# Patient Record
Sex: Female | Born: 1960 | Race: White | Hispanic: No | Smoking: Never smoker
Health system: Southern US, Community
[De-identification: ages and names within clinical notes are randomized; demographics above are authoritative.]

## PROBLEM LIST (undated history)

## (undated) DIAGNOSIS — E785 Hyperlipidemia, unspecified: Secondary | ICD-10-CM

## (undated) DIAGNOSIS — I10 Essential (primary) hypertension: Secondary | ICD-10-CM

## (undated) DIAGNOSIS — K219 Gastro-esophageal reflux disease without esophagitis: Secondary | ICD-10-CM

## (undated) DIAGNOSIS — G43909 Migraine, unspecified, not intractable, without status migrainosus: Secondary | ICD-10-CM

## (undated) DIAGNOSIS — E7800 Pure hypercholesterolemia, unspecified: Secondary | ICD-10-CM

## (undated) DIAGNOSIS — T753XXA Motion sickness, initial encounter: Secondary | ICD-10-CM

## (undated) DIAGNOSIS — N159 Renal tubulo-interstitial disease, unspecified: Secondary | ICD-10-CM

## (undated) DIAGNOSIS — N3090 Cystitis, unspecified without hematuria: Secondary | ICD-10-CM

## (undated) HISTORY — DX: Essential (primary) hypertension: I10

## (undated) HISTORY — DX: Hyperlipidemia, unspecified: E78.5

## (undated) HISTORY — PX: CHOLECYSTECTOMY: SHX55

## (undated) HISTORY — PX: KNEE SURGERY: SHX244

## (undated) HISTORY — DX: Renal tubulo-interstitial disease, unspecified: N15.9

## (undated) HISTORY — DX: Migraine, unspecified, not intractable, without status migrainosus: G43.909

## (undated) HISTORY — DX: Cystitis, unspecified without hematuria: N30.90

## (undated) HISTORY — DX: Pure hypercholesterolemia, unspecified: E78.00

---
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
## 1980-10-13 DIAGNOSIS — O99891 Other specified diseases and conditions complicating pregnancy: Secondary | ICD-10-CM

## 1980-10-13 HISTORY — DX: Other specified diseases and conditions complicating pregnancy: O99.891

## 1996-10-13 HISTORY — PX: CHOLECYSTECTOMY: SHX55

## 1999-10-14 HISTORY — PX: MENISCECTOMY: SHX123

## 2005-10-13 HISTORY — PX: OTHER SURGICAL HISTORY: SHX169

## 2014-06-21 ENCOUNTER — Inpatient Hospital Stay: Admit: 2014-06-21 | Discharge: 2014-06-21 | Disposition: A | Discharge status: Home / Self Care | Payer: Self-pay

## 2017-07-13 ENCOUNTER — Inpatient Hospital Stay: Admit: 2017-07-13 | Discharge: 2017-07-13 | Disposition: A | Discharge status: Home / Self Care | Payer: Self-pay

## 2018-07-30 ENCOUNTER — Inpatient Hospital Stay: Admit: 2018-07-30 | Discharge: 2018-07-30 | Disposition: A | Discharge status: Home / Self Care | Payer: Self-pay

## 2019-08-16 NOTE — Progress Notes (Signed)
Patient referred by Caren Macadam, MD for chest pain  Subjective:   Amy Chan, female    DOB: 1961/07/30, 58 y.o.   MRN: [id number]   Chief Complaint  Patient presents with  . Chest Pain  . Palpitations  . Hospitalization Follow-up    HPI  58 y.o. Caucasian female with yperlipidemia, prior h/o hypertension, referred for evaluation of chest pain, palpitations.  Patient lives with her partner and her daughter, 59, with Down's syndrome-for whom she is the primary caregiver. She has 4 other children. She endorses stress related to the caregiver burden. She has experienced episodes of chest pain, retrosternal, with or without physical activity, lasting for a few min. Pain is not necessarily worse with physical exertion. She also experiences episodes of "flutter sensation" that last for several minutes. They are sometimes associated with shortness of breath, but denies any presyncope or syncope, orthopnea, PND.   Patient was previously on HCTZ, but was able to come off it without any rebound hypertension. LDL elevated, details below.   Patient has FH of AAA rupture in her father. She also has FH brain aneurysm. She underwent brain MRI, which did not show brain aneurysm.  Past Medical History:  Diagnosis Date  . Hyperlipidemia   . Hypertension     Past Surgical History:  Procedure Laterality Date  . CESAREAN SECTION     3x  . CHOLECYSTECTOMY     1998  . KNEE SURGERY     1998, 2000     Social History   Socioeconomic History  . Marital status: Soil scientist    Spouse name: Not on file  . Number of children: 4  . Years of education: Not on file  . Highest education level: Not on file  Occupational History  . Not on file  Social Needs  . Financial resource strain: Not on file  . Food insecurity    Worry: Not on file    Inability: Not on file  . Transportation needs    Medical: Not on file    Non-medical: Not on file  Tobacco Use  . Smoking status: Never  Smoker  . Smokeless tobacco: Never Used  Substance and Sexual Activity  . Alcohol use: Not on file    Comment: occ  . Drug use: Not on file  . Sexual activity: Not on file  Lifestyle  . Physical activity    Days per week: Not on file    Minutes per session: Not on file  . Stress: Not on file  Relationships  . Social Herbalist on phone: Not on file    Gets together: Not on file    Attends religious service: Not on file    Active member of club or organization: Not on file    Attends meetings of clubs or organizations: Not on file    Relationship status: Not on file  . Intimate partner violence    Fear of current or ex partner: Not on file    Emotionally abused: Not on file    Physically abused: Not on file    Forced sexual activity: Not on file  Other Topics Concern  . Not on file  Social History Narrative  . Not on file     Family History  Problem Relation Age of Onset  . Alzheimer's disease Mother   . Aneurysm Father        AAA rupture  . Hypertension Father   . Atrial fibrillation Father   .  Diabetes Sister   . Hypertension Brother   . Aortic aneurysm Paternal Grandfather      Current Outpatient Medications on File Prior to Visit  Medication Sig Dispense Refill  . esomeprazole (NEXIUM) 20 MG capsule Take 20 mg by mouth daily at 12 noon.     No current facility-administered medications on file prior to visit.     Cardiovascular studies:  EKG 08/17/2019: Sinus rhythm 66 bpm. Rightward axis.   Recent labs: 07/14/2019: Glucose 79.  BUN/creatinine 20/0.85.  eGFR 68/83.  Sodium 137, potassium 4.4.  Rest of the CMP normal. Cholesterol 229, triglycerides 140, HDL 47, LDL 154.   Review of Systems  Constitution: Negative for decreased appetite, malaise/fatigue, weight gain and weight loss.  HENT: Negative for congestion.   Eyes: Negative for visual disturbance.  Cardiovascular: Positive for chest pain and palpitations. Negative for dyspnea on  exertion, leg swelling and syncope.  Respiratory: Positive for shortness of breath. Negative for cough.   Endocrine: Negative for cold intolerance.  Hematologic/Lymphatic: Does not bruise/bleed easily.  Skin: Negative for itching and rash.  Musculoskeletal: Negative for myalgias.  Gastrointestinal: Negative for abdominal pain, nausea and vomiting.  Genitourinary: Negative for dysuria.  Neurological: Negative for dizziness and weakness.  Psychiatric/Behavioral: The patient is not nervous/anxious.   All other systems reviewed and are negative.        Vitals:   08/17/19 0910  BP: 123/84  Pulse: 69  Temp: 97.8 F (36.6 C)  SpO2: 99%     Body mass index is 28.67 kg/m. Filed Weights   08/17/19 0910  Weight: 167 lb (75.8 kg)     Objective:   Physical Exam  Constitutional: She is oriented to person, place, and time. She appears well-developed and well-nourished. No distress.  HENT:  Head: Normocephalic and atraumatic.  Eyes: Pupils are equal, round, and reactive to light. Conjunctivae are normal.  Neck: No JVD present.  Cardiovascular: Normal rate, regular rhythm and intact distal pulses.  No murmur heard. Pulmonary/Chest: Effort normal and breath sounds normal. She has no wheezes. She has no rales.  Abdominal: Soft. Bowel sounds are normal. There is no rebound.  Musculoskeletal:        General: No edema.  Lymphadenopathy:    She has no cervical adenopathy.  Neurological: She is alert and oriented to person, place, and time. No cranial nerve deficit.  Skin: Skin is warm and dry.  Psychiatric: She has a normal mood and affect.  Nursing note and vitals reviewed.         Assessment & Recommendations:   58 y.o. Caucasian female with yperlipidemia, prior h/o hypertension, referred for evaluation of chest pain, palpitations.  Palpitations: Likely benign etiology. Recommend 4 week event monitor and echocardiogram.   Chest pain: Atypical. Recommend exercise  treadmill stress test.  Hyperlipidemia: LDL 154. 10 yr ASCVD risk <7%. Recommend diet and lifestyle modifications.   Physical activity recommendation (The Physical Activity Guidelines for Americans. JAMA 2018;Nov 12) At least 150-300 minutes a week of moderate-intensity, or 75-150 minutes a week of vigorous-intensity aerobic physical activity, or an equivalent combination of moderate- and vigorous-intensity aerobic activity. Adults should perform muscle-strengthening activities on 2 or more days a week. Older adults should do multicomponent physical activity that includes balance training as well as aerobic and muscle-strengthening activities. Benefits of increased physical activity include lower risk of mortality including cardiovascular mortality, lower risk of cardiovascular events and associated risk factors (hypertension and diabetes), and lower risk of many cancers (including bladder, breast, colon,  endometrium, esophagus, kidney, lung, and stomach). Additional improvments have been seen in cognition, risk of dementia, anxiety and depression, improved bone health, lower risk of falls, and associated injuries.  Dietary recommendation The 2019 ACC/AHA guidelines promote nutrition as a main fixture of cardiovascular wellness, with a recommendation for a varied diet of fruit, vegetables, fish, legumes, and whole grains (Class I), as well as recommendations to reduce sodium, cholesterol, processed meats, and refined sugars (Class IIa recommendation).10 Sodium intake, a topic of some controversy as of late, is recommended to be kept at 1,500 mg/day or less, far below the average daily intake in the [place] of 3,409 mg/day, and notably below that of previous US recommendations for '300mg'$ /day.10,11 For those unable to reach 1,500 mg/day, they recommend at least a reduction of 1000 mg/day.  A Pesco-Mediterranean Diet With Intermittent Fasting: JACC Review Topic of the Week. J Am Coll Cardiol 0109;32:3557-3220  Pesco-Mediterranean diet, it is supplemented with extra-virgin olive oil (EVOO), which is the principle fat source, along with moderate amounts of dairy (particularly yogurt and cheese) and eggs, as well as modest amounts of alcohol consumption (ideally red wine with the evening meal), but few red and processed meats.   Thank you for referring the patient to [place]. Please feel free to contact with any questions.  Nigel Mormon, MD [facility] Pager: [phone] Office: [phone] If no answer Cell [phone]

## 2019-08-17 ENCOUNTER — Encounter: Payer: Self-pay | Admitting: Cardiology

## 2019-08-17 ENCOUNTER — Other Ambulatory Visit: Payer: Self-pay

## 2019-08-17 ENCOUNTER — Ambulatory Visit: Payer: Managed Care, Other (non HMO)

## 2019-08-17 ENCOUNTER — Ambulatory Visit: Payer: Managed Care, Other (non HMO) | Admitting: Cardiology

## 2019-08-17 VITALS — BP 123/84 | HR 69 | Temp 97.8°F | Ht 64.0 in | Wt 167.0 lb

## 2019-08-17 DIAGNOSIS — Z8249 Family history of ischemic heart disease and other diseases of the circulatory system: Secondary | ICD-10-CM

## 2019-08-17 DIAGNOSIS — R002 Palpitations: Secondary | ICD-10-CM | POA: Diagnosis not present

## 2019-08-17 DIAGNOSIS — R079 Chest pain, unspecified: Secondary | ICD-10-CM | POA: Diagnosis not present

## 2019-08-17 NOTE — Patient Instructions (Signed)
Physical activity recommendation (The Physical Activity Guidelines for Americans. JAMA 2018;Nov 12) At least 150-300 minutes a week of moderate-intensity, or 75-150 minutes a week of vigorous-intensity aerobic physical activity, or an equivalent combination of moderate- and vigorous-intensity aerobic activity. Adults should perform muscle-strengthening activities on 2 or more days a week. Older adults should do multicomponent physical activity that includes balance training as well as aerobic and muscle-strengthening activities. Benefits of increased physical activity include lower risk of mortality including cardiovascular mortality, lower risk of cardiovascular events and associated risk factors (hypertension and diabetes), and lower risk of many cancers (including bladder, breast, colon, endometrium, esophagus, kidney, lung, and stomach). Additional improvments have been seen in cognition, risk of dementia, anxiety and depression, improved bone health, lower risk of falls, and associated injuries.  Dietary recommendation The 2019 ACC/AHA guidelines promote nutrition as a main fixture of cardiovascular wellness, with a recommendation for a varied diet of fruit, vegetables, fish, legumes, and whole grains (Class I), as well as recommendations to reduce sodium, cholesterol, processed meats, and refined sugars (Class IIa recommendation).10 Sodium intake, a topic of some controversy as of late, is recommended to be kept at 1,500 mg/day or less, far below the average daily intake in the [place] of 3,409 mg/day, and notably below that of previous US recommendations for <2,300mg/day.10,11 For those unable to reach 1,500 mg/day, they recommend at least a reduction of 1000 mg/day.  A Pesco-Mediterranean Diet With Intermittent Fasting: JACC Review Topic of the Week. J Am Coll Cardiol 2020;76:1484-1493 Pesco-Mediterranean diet, it is supplemented with extra-virgin olive oil (EVOO), which is the principle fat source, along  with moderate amounts of dairy (particularly yogurt and cheese) and eggs, as well as modest amounts of alcohol consumption (ideally red wine with the evening meal), but few red and processed meats.  

## 2019-08-18 ENCOUNTER — Other Ambulatory Visit: Payer: Self-pay

## 2019-08-18 DIAGNOSIS — Z20822 Contact with and (suspected) exposure to covid-19: Secondary | ICD-10-CM

## 2019-08-19 LAB — NOVEL CORONAVIRUS, NAA: SARS-CoV-2, NAA: NOT DETECTED

## 2019-08-31 ENCOUNTER — Other Ambulatory Visit: Payer: Self-pay

## 2019-08-31 ENCOUNTER — Ambulatory Visit (INDEPENDENT_AMBULATORY_CARE_PROVIDER_SITE_OTHER): Payer: Managed Care, Other (non HMO)

## 2019-08-31 DIAGNOSIS — R079 Chest pain, unspecified: Secondary | ICD-10-CM | POA: Diagnosis not present

## 2019-08-31 DIAGNOSIS — R002 Palpitations: Secondary | ICD-10-CM

## 2019-08-31 DIAGNOSIS — I700 Atherosclerosis of aorta: Secondary | ICD-10-CM

## 2019-08-31 DIAGNOSIS — Z8249 Family history of ischemic heart disease and other diseases of the circulatory system: Secondary | ICD-10-CM

## 2019-09-07 ENCOUNTER — Other Ambulatory Visit: Payer: Self-pay | Admitting: Cardiology

## 2019-09-07 DIAGNOSIS — R072 Precordial pain: Secondary | ICD-10-CM

## 2019-09-07 DIAGNOSIS — R0789 Other chest pain: Secondary | ICD-10-CM

## 2019-09-07 DIAGNOSIS — R079 Chest pain, unspecified: Secondary | ICD-10-CM

## 2019-09-07 MED ORDER — METOPROLOL TARTRATE 25 MG PO TABS: 25.0000 mg | ORAL_TABLET | ORAL | 1 refills | Status: DC

## 2019-09-07 NOTE — Progress Notes (Addendum)
Given her risk factors of hyperlipidemia and inability to perform CCTA in a timely manner, treadmill stress test switched to pharmacological nuclear stress test

## 2019-09-07 NOTE — Addendum Note (Signed)
Addended by: Nigel Mormon on: 09/07/2019 12:46 PM   Modules accepted: Orders

## 2019-09-14 ENCOUNTER — Telehealth: Payer: Self-pay

## 2019-09-14 ENCOUNTER — Encounter: Payer: Self-pay | Admitting: Cardiology

## 2019-09-14 ENCOUNTER — Other Ambulatory Visit: Payer: Self-pay | Admitting: Cardiology

## 2019-09-14 DIAGNOSIS — R072 Precordial pain: Secondary | ICD-10-CM

## 2019-09-14 DIAGNOSIS — I700 Atherosclerosis of aorta: Secondary | ICD-10-CM | POA: Insufficient documentation

## 2019-09-14 DIAGNOSIS — I10 Essential (primary) hypertension: Secondary | ICD-10-CM | POA: Insufficient documentation

## 2019-09-14 DIAGNOSIS — E785 Hyperlipidemia, unspecified: Secondary | ICD-10-CM | POA: Insufficient documentation

## 2019-09-14 MED ORDER — METOPROLOL TARTRATE 25 MG PO TABS: 25.0000 mg | ORAL_TABLET | Freq: Two times a day (BID) | ORAL | 1 refills | Status: AC

## 2019-09-14 NOTE — Telephone Encounter (Signed)
Done. Sent CTA instructions as a patient message. Please follow up.  Thanks MJP

## 2019-09-16 ENCOUNTER — Telehealth: Payer: Self-pay

## 2019-09-16 NOTE — Telephone Encounter (Signed)
error 

## 2019-09-23 ENCOUNTER — Inpatient Hospital Stay: Admit: 2019-09-23 | Discharge: 2019-09-23 | Disposition: A | Discharge status: Home / Self Care | Payer: Self-pay

## 2019-09-24 NOTE — Progress Notes (Deleted)
  Subjective:   Amy Chan, female    DOB: 03/01/1961, 58 y.o.   MRN: [id number]   I connected with the patient on ***09/28/19 by a video enabled telemedicine application and verified that I am speaking with the correct person using two identifiers.     I discussed the limitations of evaluation and management by telemedicine and the availability of in person appointments. The patient expressed understanding and agreed to proceed.   This visit type was conducted due to national recommendations for restrictions regarding the COVID-19 Pandemic (e.g. social distancing).  This format is felt to be most appropriate for this patient at this time.  All issues noted in this document were discussed and addressed.  No physical exam was performed (except for noted visual exam findings with Tele health visits).  The patient has consented to conduct a Tele health visit and understands insurance will be billed.     Chief complaint:  ***  *** HPI  58 y.o. *** female with ***  *** Past Medical History:  Diagnosis Date  . Hyperlipidemia   . Hypertension     *** Past Surgical History:  Procedure Laterality Date  . CESAREAN SECTION     3x  . CHOLECYSTECTOMY     1998  . KNEE SURGERY     1998, 2000    *** Social History   Socioeconomic History  . Marital status: Domestic Partner    Spouse name: Not on file  . Number of children: 4  . Years of education: Not on file  . Highest education level: Not on file  Occupational History  . Not on file  Tobacco Use  . Smoking status: Never Smoker  . Smokeless tobacco: Never Used  Substance and Sexual Activity  . Alcohol use: Not on file    Comment: occ  . Drug use: Not on file  . Sexual activity: Not on file  Other Topics Concern  . Not on file  Social History Narrative  . Not on file   Social Determinants of Health   Financial Resource Strain:   . Difficulty of Paying Living Expenses: Not on file  Food Insecurity:   . Worried  About Running Out of Food in the Last Year: Not on file  . Ran Out of Food in the Last Year: Not on file  Transportation Needs:   . Lack of Transportation (Medical): Not on file  . Lack of Transportation (Non-Medical): Not on file  Physical Activity:   . Days of Exercise per Week: Not on file  . Minutes of Exercise per Session: Not on file  Stress:   . Feeling of Stress : Not on file  Social Connections:   . Frequency of Communication with Friends and Family: Not on file  . Frequency of Social Gatherings with Friends and Family: Not on file  . Attends Religious Services: Not on file  . Active Member of Clubs or Organizations: Not on file  . Attends Club or Organization Meetings: Not on file  . Marital Status: Not on file  Intimate Partner Violence:   . Fear of Current or Ex-Partner: Not on file  . Emotionally Abused: Not on file  . Physically Abused: Not on file  . Sexually Abused: Not on file    *** Family History  Problem Relation Age of Onset  . Alzheimer's disease Mother   . Aneurysm Father        AAA rupture  . Hypertension Father   . Atrial fibrillation Father   .   Diabetes Sister   . Hypertension Brother   . Aortic aneurysm Paternal Grandfather     *** Current Outpatient Medications on File Prior to Visit  Medication Sig Dispense Refill  . esomeprazole (NEXIUM) 20 MG capsule Take 20 mg by mouth daily at 12 noon.    . metoprolol tartrate (LOPRESSOR) 25 MG tablet Take 1 tablet (25 mg total) by mouth 2 (two) times daily for 10 doses. 10 tablet 1   No current facility-administered medications on file prior to visit.    Cardiovascular and other pertinent studies:  *** Echocardiogram 08/31/2019: Left ventricle cavity is normal in size. Mild concentric hypertrophy of the left ventricle. Normal LV systolic function with EF 58%. Normal global wall motion. Normal diastolic filling pattern.  Mild to moderate mitral regurgitation. Normal right atrial pressure.    Abdominal Aortic Duplex  08/31/2019: The maximum aorta (sac) diameter is 2.07 cm (mid). No AAA.  Mild plaque observed in the proximal, mid and distal aorta. Normal flow velocities noted in the aorta and iliac arteries.   Event monitor 08/17/2019 - 09/15/2019: Diagnostic time: 97%  Dominant rhythm: Sinus. HR 51-136 bpm. Avg HR 72 bpm. Occasional PAC's/sinus arrhthymia noted.  No atrial fibrillation/atrial flutter/SVT/VT/high grade AV block, sinus pause >3sec noted. Symptoms reported: Multiple episodes of chest tightness, pressure sensation, skipped beats. Occasional episodes correlated with PAC's. Symptoms reported are out of proportion to findings noted on event monitor.   EKG ***/***/202***: ***  *** Recent labs: ***/***/202***: Glucose ***, BUN/Cr ***/***. EGFR ***. Na/K ***/***. Rest of the CMP normal H/H ***/***. MCV ***. Platelets *** ***HbA1C ***% Chol ***, TG ***, HDL ***, LDL *** ***TSH ***normal   *** ROS      *** There were no vitals filed for this visit. (Measured by the patient using a home BP monitor)  There is no height or weight on file to calculate BMI. There were no vitals filed for this visit.  *** Observation/findings during video visit   Objective:    Physical Exam        Assessment & Recommendations:   ***  ***   Yarden Hillis Esther Hardy, MD [facility] Pager: [phone] Office: [phone]

## 2019-09-26 ENCOUNTER — Other Ambulatory Visit: Payer: Managed Care, Other (non HMO)

## 2019-09-27 ENCOUNTER — Other Ambulatory Visit: Payer: Self-pay | Admitting: Obstetrics and Gynecology

## 2019-09-27 DIAGNOSIS — R928 Other abnormal and inconclusive findings on diagnostic imaging of breast: Secondary | ICD-10-CM

## 2019-09-28 ENCOUNTER — Telehealth: Payer: Managed Care, Other (non HMO) | Admitting: Cardiology

## 2019-09-28 ENCOUNTER — Encounter: Payer: Self-pay | Admitting: Cardiology

## 2019-10-05 ENCOUNTER — Ambulatory Visit: Payer: Managed Care, Other (non HMO) | Attending: Internal Medicine

## 2019-10-05 DIAGNOSIS — Z20822 Contact with and (suspected) exposure to covid-19: Secondary | ICD-10-CM

## 2019-10-06 LAB — NOVEL CORONAVIRUS, NAA: SARS-CoV-2, NAA: NOT DETECTED

## 2019-10-24 ENCOUNTER — Ambulatory Visit
Admission: RE | Admit: 2019-10-24 | Discharge: 2019-10-24 | Disposition: A | Discharge status: Home / Self Care | Payer: Managed Care, Other (non HMO) | Source: Ambulatory Visit | Attending: Obstetrics and Gynecology | Admitting: Obstetrics and Gynecology

## 2019-10-24 ENCOUNTER — Other Ambulatory Visit: Payer: Self-pay

## 2019-10-24 ENCOUNTER — Ambulatory Visit: Payer: Managed Care, Other (non HMO)

## 2019-10-24 ENCOUNTER — Inpatient Hospital Stay: Admit: 2019-10-24 | Discharge: 2019-10-24 | Disposition: A | Discharge status: Home / Self Care | Payer: Self-pay

## 2019-10-24 DIAGNOSIS — R928 Other abnormal and inconclusive findings on diagnostic imaging of breast: Secondary | ICD-10-CM

## 2019-10-31 ENCOUNTER — Encounter (HOSPITAL_COMMUNITY): Payer: Self-pay

## 2019-10-31 ENCOUNTER — Telehealth (HOSPITAL_COMMUNITY): Payer: Self-pay | Admitting: Emergency Medicine

## 2019-10-31 NOTE — Telephone Encounter (Signed)
Invalid number

## 2019-11-01 ENCOUNTER — Encounter (HOSPITAL_COMMUNITY): Payer: Self-pay

## 2019-11-01 ENCOUNTER — Ambulatory Visit (HOSPITAL_COMMUNITY)
Admission: RE | Admit: 2019-11-01 | Discharge: 2019-11-01 | Disposition: A | Discharge status: Home / Self Care | Payer: Managed Care, Other (non HMO) | Source: Ambulatory Visit | Attending: Cardiology | Admitting: Cardiology

## 2019-11-01 ENCOUNTER — Other Ambulatory Visit: Payer: Self-pay

## 2019-11-01 DIAGNOSIS — R072 Precordial pain: Secondary | ICD-10-CM | POA: Insufficient documentation

## 2019-11-01 DIAGNOSIS — Z006 Encounter for examination for normal comparison and control in clinical research program: Secondary | ICD-10-CM

## 2019-11-01 MED ORDER — NITROGLYCERIN 0.4 MG SL SUBL
SUBLINGUAL_TABLET | SUBLINGUAL | Status: AC
  Filled 2019-11-01: qty 2

## 2019-11-01 MED ORDER — IOHEXOL 350 MG/ML SOLN
80.0000 mL | Freq: Once | INTRAVENOUS | Status: AC | PRN
  Administered 2019-11-01: 80 mL via INTRAVENOUS

## 2019-11-01 MED ORDER — NITROGLYCERIN 0.4 MG SL SUBL
0.8000 mg | SUBLINGUAL_TABLET | Freq: Once | SUBLINGUAL | Status: AC
  Administered 2019-11-01: 0.8 mg via SUBLINGUAL

## 2019-11-01 NOTE — Research (Signed)
Cadfem Informed Consent    Patient Name: Amy Chan    Subject met inclusion and exclusion criteria.  The informed consent form, study requirements and expectations were reviewed with the subject and questions and concerns were addressed prior to the signing of the consent form.  The subject verbalized understanding of the trail requirements.  The subject agreed to participate in the CADFEM trial and signed the informed consent.  The informed consent was obtained prior to performance of any protocol-specific procedures for the subject.  A copy of the signed informed consent was given to the subject and a copy was placed in the subject's medical record.   Neva Seat

## 2019-11-01 NOTE — Progress Notes (Signed)
Patient tolerated CT without incident. Patient drank water and ate chips after. Ambulatory steady gait to exit.

## 2019-11-02 ENCOUNTER — Telehealth: Payer: Self-pay

## 2019-11-02 NOTE — Telephone Encounter (Signed)
-----   Message from [place], MD sent at 11/02/2019  8:43 AM EST ----- CTA is normal. No calcium/plaque buildup noted inside coronary arteries. Great news.  Thanks MJP

## 2019-11-16 ENCOUNTER — Ambulatory Visit: Payer: Self-pay | Attending: Internal Medicine

## 2019-11-16 ENCOUNTER — Other Ambulatory Visit: Payer: Self-pay

## 2019-11-16 DIAGNOSIS — Z20822 Contact with and (suspected) exposure to covid-19: Secondary | ICD-10-CM | POA: Insufficient documentation

## 2019-11-17 LAB — NOVEL CORONAVIRUS, NAA: SARS-CoV-2, NAA: NOT DETECTED

## 2019-11-18 ENCOUNTER — Other Ambulatory Visit: Payer: Self-pay

## 2019-12-24 ENCOUNTER — Ambulatory Visit: Payer: Self-pay | Attending: Internal Medicine

## 2019-12-24 DIAGNOSIS — Z23 Encounter for immunization: Secondary | ICD-10-CM

## 2019-12-24 NOTE — Progress Notes (Signed)
   Covid-19 Vaccination Clinic  Name:  Makensie Mulhall    MRN: [id number] DOB: 1961-07-05  12/24/2019  Ms. Blattner was observed post Covid-19 immunization for 15 minutes without incident. She was provided with Vaccine Information Sheet and instruction to access the V-Safe system.   Ms. Mataya was instructed to call 911 with any severe reactions post vaccine: Marland Kitchen Difficulty breathing  . Swelling of face and throat  . A fast heartbeat  . A bad rash all over body  . Dizziness and weakness   Immunizations Administered    Name Date Dose VIS Date Route   Pfizer COVID-19 Vaccine 12/24/2019 11:10 AM 0.3 mL 09/23/2019 Intramuscular   Manufacturer: [employer], [employer]   Lot: [id number]   NDC: [address]-0

## 2020-01-18 ENCOUNTER — Ambulatory Visit: Payer: Self-pay | Attending: Internal Medicine

## 2020-01-18 DIAGNOSIS — Z23 Encounter for immunization: Secondary | ICD-10-CM

## 2020-01-18 NOTE — Progress Notes (Signed)
   Covid-19 Vaccination Clinic  Name:  Amy Chan    MRN: [id number] DOB: 22-Oct-1960  01/18/2020  Amy Chan was observed post Covid-19 immunization for 15 minutes without incident. She was provided with Vaccine Information Sheet and instruction to access the V-Safe system.   Amy Chan was instructed to call 911 with any severe reactions post vaccine: Marland Kitchen Difficulty breathing  . Swelling of face and throat  . A fast heartbeat  . A bad rash all over body  . Dizziness and weakness   Immunizations Administered    Name Date Dose VIS Date Route   Pfizer COVID-19 Vaccine 01/18/2020  3:47 PM 0.3 mL 09/23/2019 Intramuscular   Manufacturer: [employer], [employer]   Lot: [id number]   NDC: [address]-6

## 2020-01-31 ENCOUNTER — Telehealth: Payer: Self-pay | Admitting: *Deleted

## 2020-01-31 NOTE — Telephone Encounter (Signed)
Called patient for 90 day phone call follow up. Left message for her to call me back.      Amy Chan  01/31/2020  15:00 p.m.

## 2020-02-17 DIAGNOSIS — Z13220 Encounter for screening for lipoid disorders: Secondary | ICD-10-CM | POA: Diagnosis not present

## 2020-02-17 DIAGNOSIS — Z1159 Encounter for screening for other viral diseases: Secondary | ICD-10-CM | POA: Diagnosis not present

## 2020-02-17 DIAGNOSIS — Z0000 Encounter for general adult medical examination without abnormal findings: Secondary | ICD-10-CM | POA: Diagnosis not present

## 2020-07-24 ENCOUNTER — Encounter: Payer: Self-pay | Admitting: Gastroenterology

## 2020-08-07 ENCOUNTER — Telehealth: Payer: Self-pay

## 2020-08-07 NOTE — Telephone Encounter (Signed)
Patient calling regarding referral. Referral scanned in for Malignant neoplasm of Colon. She is wondering how soon this is needed. Would like to be scheduled for sometime in January due to transport. States no Colon issues but is having GERD issues. She can be reached at [phone].

## 2020-08-07 NOTE — Telephone Encounter (Signed)
Referral to Dr. Decross

## 2020-08-13 NOTE — Telephone Encounter (Signed)
Referral sent to Dr. DeCross for review.

## 2020-08-14 NOTE — Telephone Encounter (Addendum)
Writer made 1st attempt to schedule a NPV with an APP and colonoscopy and MD. Left message for patient to call back and schedule     Decross, Merton Border, MD  Talynn Lebon, Marchelle Folks    Set her up with an APP for an office visit, and any available provider for a colonoscopy. First available.   Dr.D

## 2020-08-16 NOTE — Telephone Encounter (Signed)
Writer made 2nd attempt to schedule a NPV for GERD next avail APP, and a COD any MD next available.

## 2020-08-16 NOTE — Telephone Encounter (Signed)
Copied from CRM [id number]. Topic: Return Call - Schedule Appointment  >> Aug 16, 2020  3:57 PM Joya Salm wrote:  Patient returning call to schedule NPV & colonoscopy. Writer unable to schedule.  Patient may be reached at [phone]        Spoke with Michele Chandler , patient, regarding scheduling of procedure, details listed below:    Are you on any blood thinners? no   If yes, who prescribes them and what is their phone number?  n/a     Are you diabetic? no    Do you have an automated defibrillator? NO    Do you have an LVAD? NO     Do you have a tracheostomy? NO    Do you use BIPAP or oxygen at home? NO    For scheduling safety precautions, is the patient's BMI under 45? There is no height or weight on file to calculate BMI.    NOT LISTED PATIENT'S REPORTED HEIGHT 5'4   PATIENT'S REPORTED WEIGHT 157      FOR ENDOSCOPY (includes CEN) SCHEDULING ONLY- Have you ever had banding for esophageal varices?     FOR COLONOSCOPY SCHEDULING ONLY-To ensure appropriate cleanse preparation, do you move your bowels daily or every other day? YES

## 2020-08-21 NOTE — Telephone Encounter (Signed)
Writer returned patient's call to schedule a NPV with APP and COD and MD. Left message asking patient to call back and schedule. Ok to IM me if unable to schedule.

## 2020-08-22 NOTE — Telephone Encounter (Signed)
Writer spoke with patient in regards to scheduling appointments. Offered to schedule an office visit with APP to help with GERD, but patient states her PCP is helping her manage that right now. Patient is scheduled a for a COD on 1/13 at 2:45 arrival with Dr. Teresa Pelton at [facility]. Prep being sent via Mail . Updated address.

## 2020-10-10 ENCOUNTER — Telehealth: Payer: Self-pay

## 2020-10-10 NOTE — Telephone Encounter (Signed)
Called and Spoke with patient and discussed the following  items prior to their COD procedure on 10/25/20 with  Dr. Teresa Pelton.      Has the patient received their prep instructions? YES via mail. Reviewed [employer] and COVID testing Sites. Can do COVID test in [place] or [place].     Has the patient discussed how to proceed with any prescribed blood thinning medications excluding Aspirin with a nurse, if applicable? No Comment: N/A                  Have you recently developed any of the following symptoms without a known cause: NO   Body aches   Congestion/runny nose   Cough   Difficulty breathing   Fever   Loss of taste/smell   Severe fatigue   Severe migraine   Shaking chills   Sore throat    Have you been told to quarantine at home due to exposure to COVID 19 in the last 14 days? NO    Have you traveled internationally in the last 10 days?NO    Have you had a positive result to a COVID PCR in the last 10 days? NO    PATIENT WAS INFORMED A NEGATIVE COVID RESULT MUST BE OBTAINED IN ORDER TO PROCEED WITH SCHEDULED PROCEDURE.

## 2020-10-11 ENCOUNTER — Other Ambulatory Visit: Payer: Self-pay

## 2020-10-11 DIAGNOSIS — Z01812 Encounter for preprocedural laboratory examination: Secondary | ICD-10-CM

## 2020-10-18 ENCOUNTER — Telehealth: Payer: Self-pay

## 2020-10-18 NOTE — Telephone Encounter (Addendum)
Chart reviewed by Nurse Navigator for exclusion criteria (AICD,LVAD, ESLD, known varices/banding, BIPAP, BMI >45, O2 dependent, tracheotomy patient) to determine appropriate procedure location, appropriate prep instructions ordered/received by patient, coags, and level of sedation.    BMI documented in chart from 07/19/20 PCP office note in Media tab, is 28.3.

## 2020-10-21 ENCOUNTER — Other Ambulatory Visit
Admission: RE | Admit: 2020-10-21 | Discharge: 2020-10-21 | Disposition: A | Discharge status: Home / Self Care | Payer: No Typology Code available for payment source | Source: Ambulatory Visit | Attending: Gastroenterology | Admitting: Gastroenterology

## 2020-10-21 DIAGNOSIS — Z20822 Contact with and (suspected) exposure to covid-19: Secondary | ICD-10-CM | POA: Insufficient documentation

## 2020-10-21 DIAGNOSIS — Z20828 Contact with and (suspected) exposure to other viral communicable diseases: Secondary | ICD-10-CM | POA: Insufficient documentation

## 2020-10-22 LAB — COVID-19 PCR

## 2020-10-22 LAB — COVID-19 NAAT (PCR): COVID-19 NAAT (PCR): NEGATIVE

## 2020-10-23 ENCOUNTER — Telehealth: Payer: Self-pay

## 2020-10-23 NOTE — Telephone Encounter (Signed)
Patient called back, via our Call Center [facility]) to let us know that her new arrival time of 1:15pm on Thursday January 13 with Dr Teresa Pelton will work for her and her ride.

## 2020-10-23 NOTE — Telephone Encounter (Signed)
Called patient in regards to her upcoming procedure, COD, scheduled for Thursday January 13 with Dr Teresa Pelton.  Left patient a message stating that there has been a cancellation for earlier in the afternoon and asked if she would be interested in the earlier arrival time of 1:15pm.  Gave patient the number to the nurse's station to let us know.

## 2020-10-25 ENCOUNTER — Encounter: Payer: Self-pay | Admitting: Gastroenterology

## 2020-10-25 ENCOUNTER — Ambulatory Visit
Admission: RE | Admit: 2020-10-25 | Discharge: 2020-10-25 | Disposition: A | Discharge status: Home / Self Care | Payer: No Typology Code available for payment source | Source: Ambulatory Visit | Attending: Gastroenterology | Admitting: Gastroenterology

## 2020-10-25 DIAGNOSIS — Z1211 Encounter for screening for malignant neoplasm of colon: Secondary | ICD-10-CM | POA: Insufficient documentation

## 2020-10-25 DIAGNOSIS — K635 Polyp of colon: Secondary | ICD-10-CM | POA: Insufficient documentation

## 2020-10-25 DIAGNOSIS — K621 Rectal polyp: Secondary | ICD-10-CM | POA: Insufficient documentation

## 2020-10-25 HISTORY — DX: Gastro-esophageal reflux disease without esophagitis: K21.9

## 2020-10-25 LAB — HM COLONOSCOPY

## 2020-10-25 MED ORDER — MIDAZOLAM HCL 1 MG/ML IJ SOLN *I* WRAPPED
INTRAMUSCULAR | Status: AC | PRN
Start: 2020-10-25 — End: 2020-10-25
  Administered 2020-10-25 (×3): 2 mg via INTRAVENOUS

## 2020-10-25 MED ORDER — LACTATED RINGERS IV SOLN *I*
100.0000 mL/h | INTRAVENOUS | Status: DC
Start: 2020-10-25 — End: 2020-10-26
  Administered 2020-10-25: 100 mL/h via INTRAVENOUS

## 2020-10-25 MED ORDER — MIDAZOLAM HCL 1 MG/ML IJ SOLN *I* WRAPPED
INTRAMUSCULAR | Status: AC
Start: 2020-10-25 — End: 2020-10-25
  Filled 2020-10-25: qty 10

## 2020-10-25 NOTE — Procedures (Addendum)
Colonoscopy Procedure Note   Date of Procedure: 10/25/2020   Referring Physician: Earnest Conroy, MD  Primary Physician: Unknown, Provider        Attending Physician: Quincy Simmonds, MD  Fellow:   None  Indications:  Colorectal cancer screening-average risk  Denies GI sxs, FHx CRC/polyps or other cancers in fam  Previous colonoscopy: Yes -- Date: 2012 with Dr Wyn Quaker in [place]  Medications: Midazolam 6 mg IV were administered incrementally over the course of the procedure to achieve an adequate level of conscious sedation.  No narcotic administered at patient's request (intolerance to morphine, and she was nervous about potential reactions to other agents)    Moderate Sedation Face Times  Start Time: 1445  End Time: 1507  Duration (minutes): 22 Minutes        I provided moderate sedation.  During this time I was face-to-face with the patient and supervising the RN; who monitored the patient's level of consciousness and physiological status.    Scopes: [id number]    Procedure Details: Full disclosures of risks were reviewed with patient as detailed on the consent form. The patient was placed in the left lateral decubitus position and monitored with continuous pulse oximetry, interval blood pressure monitoring and direct observations.   After anorectal examination was performed, the colonoscope was inserted into the rectum and advanced under direct vision to the terminal ileum. The procedure was considered not difficult.  During withdrawal examination, the final quality of the prep was [facility] Bowel Prep Scale:  Right Colon: Grade3- (entire mucosa of colon segment seen well, with no residual staining, small fragments of stool, or opaque liquid)  Transverse Colon: Grade 3- (entire mucosa of colon segment seen well, with no residual staining, small fragments of stool, or opaque liquid)  Left Colon: Grade 3- (entire mucosa of colon segment seen well, with no residual staining, small fragments of stool, or opaque liquid)  A  careful inspection was made as the colonoscope was withdrawn. A retroflexed view of the rectum was performed; findings and interventions are described below. The patient was recovered in the GI recovery area.     Scope Times:     Insertion Time: 1449  Cecum Time: 1452  Exit Time: 1506  Withdrawal time - 14 min     Findings: normal perineum and anus on DRE.  Scope passed to cecum and into TI x 10cm.  Full view of distended caput cecum including medial wall from valve to AO, and a right colon retroflexion were obtained, and photodocumented.  At 60cm, two 46mm polyps removed by exacto cold snare, edges further debrided with cold forceps.  At 5cm in rectum, a 29mm polyp removed by cold snare.    Rectal retroflexion WNL    Intervention(s):      3 polyp(s) removed by cold snare biopsy      Complication(s):     none    EBL: 1 ml    Impression(s):     three tiny polyps    Recommendation(s):     Await pathology.   timing of next colonoscopy depends upon biopsy results of polyps removed today    Histopathologic Diagnosis: these polyps do not require accelerated follow up. Her next colonoscopy can be in 10 years.   FINAL DIAGNOSIS:   A. Colon polyps, 60 cm, polypectomies-   -Colonic mucosa, negative for dysplasia     B. Rectum, polypectomy-   -Hyperplastic polyp     Quincy Simmonds, MD

## 2020-10-25 NOTE — Discharge Instructions (Signed)
Gastroenterology Unit  Discharge Instructions for Colonoscopy      10/25/2020    3:08 PM    Colonoscopy and Polyp(s) Removed    Do not drive, operate heavy machinery, drink alcoholic beverages, make important personal or business decisions, or sign legal documents until the next day.      Return to your usual diet   Return to taking your usual medications    Things you may expect:   A small amount of bright red blood in your stool   It may be a few days before you have a bowel movement   You may have cramping, bloating, and feelings of "gas". These feelings should go away as you pass gas. If you still feel uncomfortable, walking around will help to pass the gas.   You were given medication to help you relax during the test. You may feel "fuzzy" and drowsy. Go home and rest for at least 4-6 hours.    You should call your doctor for any of the following:   Bad stomach pain   Fever   Bright red bleeding or clots (This may happen up to 20 days after the test.)   Dizziness or weakness that gets worse or lasts up to 24 hours.   Pain or redness at the IV site    If you have a serious problem after hours, Call [phone] to reach the GI physician on call. If you are unable to reach your doctor, go to the [facility] Emergency Department.    Follow Up Care:   If biopsies were taken during your procedure, we will send you the pathology results within 7-10 days. If you do not receive your pathology results after 10 days please call [phone]   Repeat exam in  5 year(s) (depending upon the biopsy results)    New Prescriptions    No medications on file

## 2020-10-25 NOTE — Preop H&P (Signed)
OUTPATIENT PRE-PROCEDURE H&P    Chief Complaint / Indications for Procedure: CRC screen    Past Medical History:     Past Medical History:   Diagnosis Date    GERD (gastroesophageal reflux disease)      Past Surgical History:   Procedure Laterality Date    CESAREAN SECTION, CLASSIC      X3    CHOLECYSTECTOMY  1998     History reviewed. No pertinent family history.  Social History     Socioeconomic History    Marital status: Married     Spouse name: Not on file    Number of children: Not on file    Years of education: Not on file    Highest education level: Not on file   Tobacco Use    Smoking status: Never Smoker    Smokeless tobacco: Never Used   Substance and Sexual Activity    Alcohol use: Yes     Comment: occasionally    Drug use: Not on file    Sexual activity: Not on file   Other Topics Concern    Not on file   Social History Narrative    Not on file       Allergies:    Allergies   Allergen Reactions    Morphine Nausea And Vomiting       Medications:  (Not in a hospital admission)     Current Outpatient Medications   Medication    esomeprazole (NEXIUM) 20 mg capsule    Aspirin-Acetaminophen-Caffeine (EXCEDRIN MIGRAINE PO)     Current Facility-Administered Medications   Medication Dose Route Frequency    Lactated Ringers Infusion  100 mL/hr Intravenous Continuous     Vitals:    10/25/20 1320   BP: 125/81   Temp: 36.1 C (97 F)   Weight: 71.2 kg (157 lb)   Height: 162.6 cm (5\' 4" )       ROS:     Physical Examination:  Head/Nose/Throat:negative  Lungs:Lungs clear  Cardiovascular:normal S1 and S2  Abdomen: abdomen soft, non-tender, nondistended, normal active bowel sounds, no masses or organomegaly      Lab Results: none    Radiology impressions (last 30 days):  No results found.    Currently Active Problems:  There is no problem list on file for this patient.       Impression:  Screening colonoscopy    Plan:  Colonoscopy  Moderate Sedation    UPDATES TO PATIENT'S CONDITION on the  DAY OF SURGERY/PROCEDURE    I. Updates to Patient's Condition (to be completed by a provider privileged to complete a H&P, following reassessment of the patient by the provider):    Full H&P done today; no updates needed.    II. Procedure Readiness   I have reviewed the patient's H&P and updated condition. By completing and signing this form, I attest that this patient is ready for surgery/procedure.      III. Attestation   I have reviewed the updated information regarding the patient's condition and it is appropriate to proceed with the planned surgery/procedure.    HE:[id number], MD as of 2:39 PM 10/25/2020

## 2020-10-27 ENCOUNTER — Telehealth: Payer: Self-pay | Admitting: Student in an Organized Health Care Education/Training Program

## 2020-10-27 NOTE — Telephone Encounter (Signed)
Spoke to the patient, she was feeling well following her colonoscopy until this morning. She began develop severe cramping pain in her RLQ which is now radiating around her abdomen. She has been tolerating PO, and is passing stool flatus. She does feel the pain is getting worse, she has taken Motrin twice and does not feel it is much worse. She states she had a colonoscopy 10 years prior without similar symptoms.    Reviewed her colonoscopy report, she has three small polyps removed without use of cautery, thus this would not be consistent with post polypectomy syndrome. The procedure does not appear to be complicated, and I think the risk of perforation is low. Her pain is not consistent with splenic laceration.  I think if her pain does not get better over the next 12 hours then she should go to her local ED [facility]) for an evaluation.  We also discussed that if she has fevers, new abdominal distention, severe pain or PO intolerance those would be reasons to be evaluated promptly this evening. We also discussed that this may not be related to the procedures, and kidney stones, appendicitis and GYN causes should all be considered. She was appreciative of the call. She will monitor her symptoms and go to her local ED if she is not improving.

## 2020-11-08 ENCOUNTER — Other Ambulatory Visit: Payer: Self-pay | Admitting: Family Medicine

## 2020-11-08 DIAGNOSIS — N950 Postmenopausal bleeding: Secondary | ICD-10-CM

## 2020-11-08 LAB — SURGICAL PATHOLOGY

## 2020-11-09 NOTE — Result Encounter Note (Signed)
Jae Dire please send letter:    Dear Ms Michele Chandler:  The polyps removed during your colonoscopy were benign.  Based on your clinical information, and current national guidelines, I recommend that you repeat a colonoscopy once again in ten (10) years.  In the event that one sibling or parent develops, has or had colon cancer or advanced colon polyps prior to age 61, then colonoscopy should be pursued every 5 years. If two or more siblings or parents have colon cancer or polyps at any age, then colonoscopy should be pursued every 5 years.   Sincerely,  Dr. Teresa Pelton

## 2020-11-15 ENCOUNTER — Other Ambulatory Visit: Payer: No Typology Code available for payment source

## 2020-11-20 ENCOUNTER — Ambulatory Visit
Admission: RE | Admit: 2020-11-20 | Discharge: 2020-11-20 | Disposition: A | Discharge status: Home / Self Care | Payer: No Typology Code available for payment source | Source: Ambulatory Visit | Attending: Family Medicine | Admitting: Family Medicine

## 2020-11-20 DIAGNOSIS — N950 Postmenopausal bleeding: Secondary | ICD-10-CM | POA: Insufficient documentation

## 2020-11-20 DIAGNOSIS — N8500 Endometrial hyperplasia, unspecified: Secondary | ICD-10-CM | POA: Insufficient documentation

## 2020-11-26 IMAGING — US US BREAST*L* LIMITED INC AXILLA
1 series · 6 of 6 positions shown · non-contrast
Comparison: 09/23/2019 and earlier

CLINICAL DATA: Patient returns after screening for evaluation of
possible asymmetry seen in both breasts.

EXAM:
DIGITAL DIAGNOSTIC BILATERAL MAMMOGRAM WITH CAD AND TOMO
ULTRASOUND LEFT BREAST

[Series 1: us breast*left* limited inc axilla · 0.06mm/px · 6 of 6 slices shown]
[im 1/6]
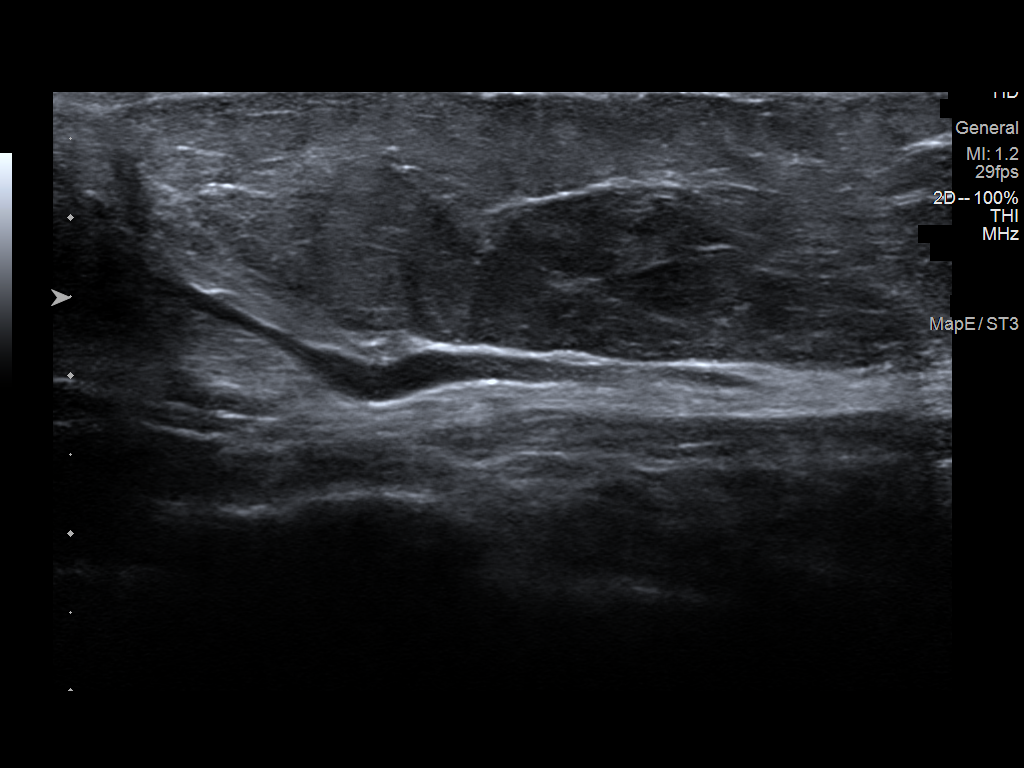
[im 2/6]
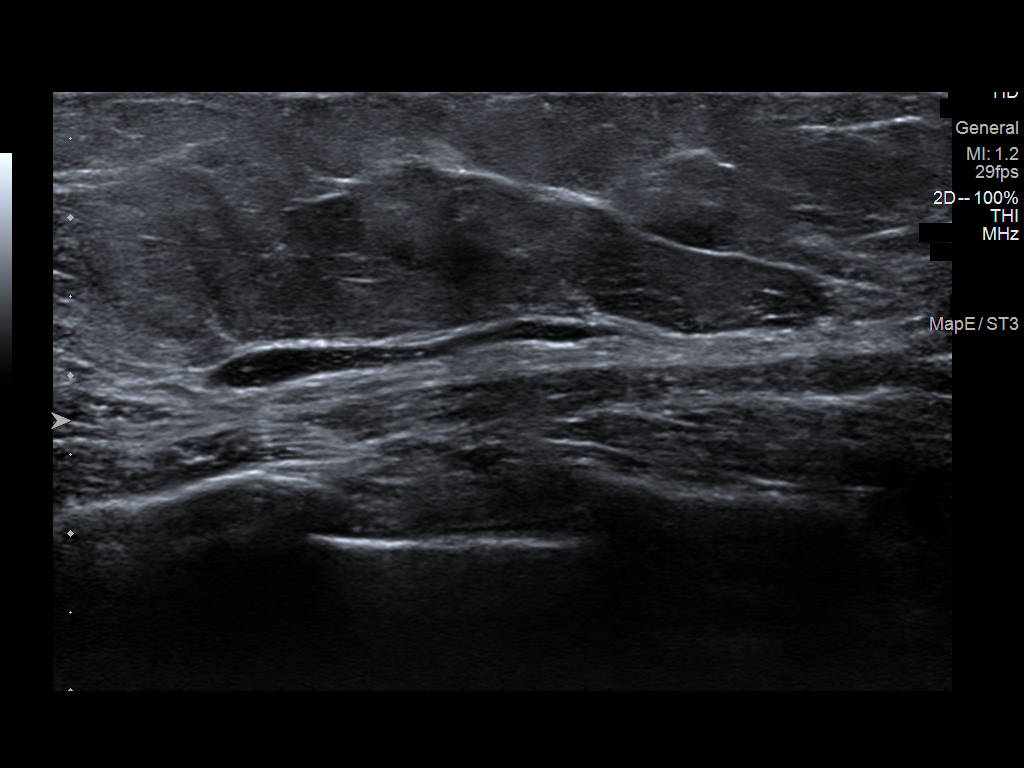
[im 3/6]
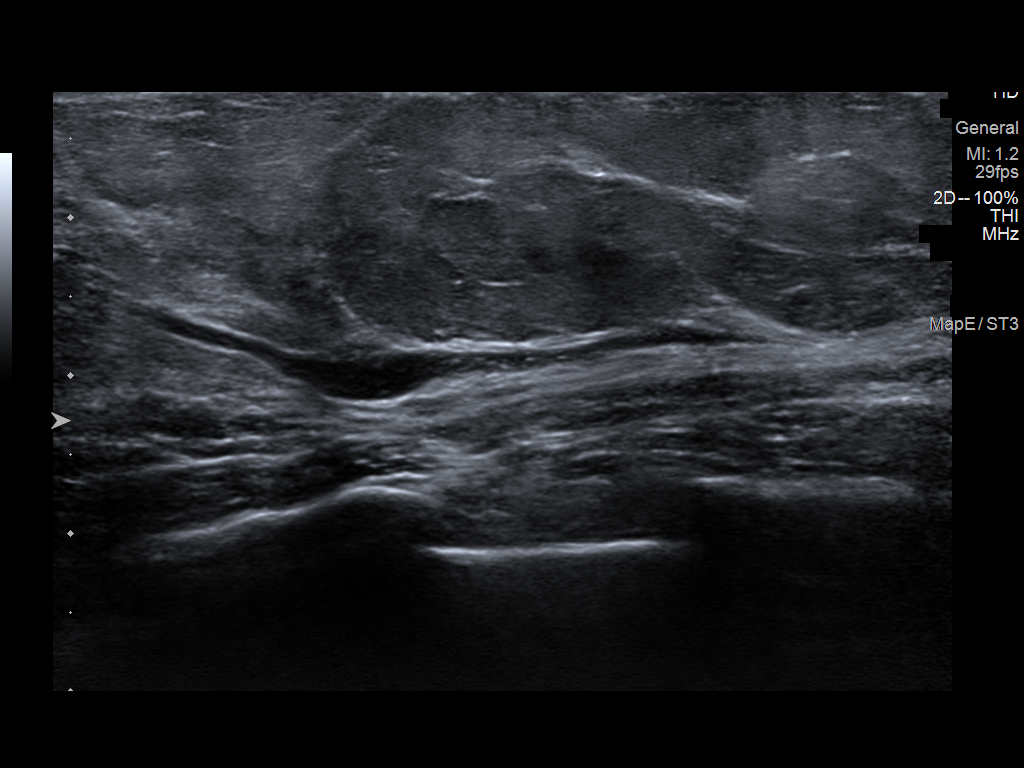
[im 4/6]
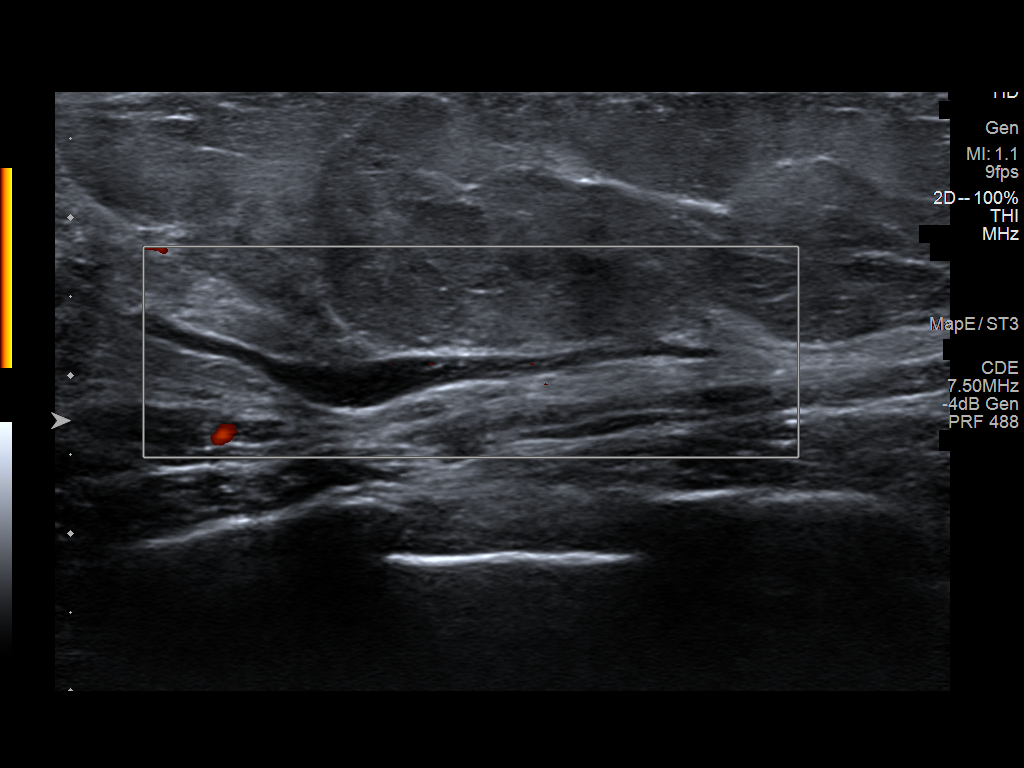
[im 5/6]
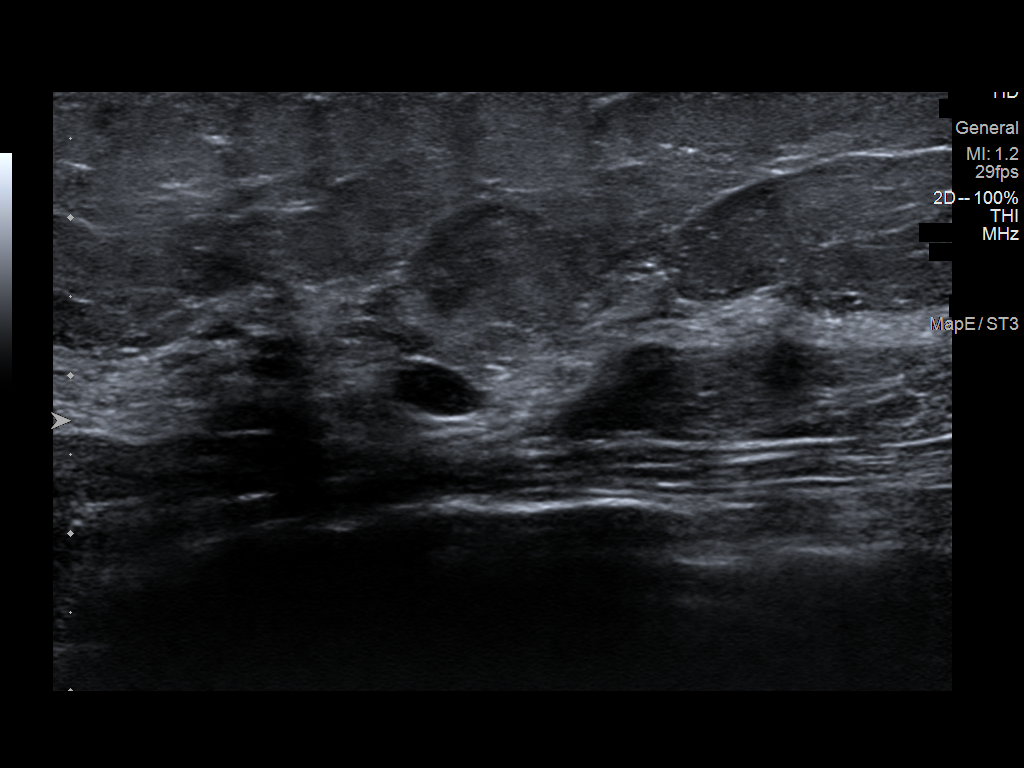
[im 6/6]
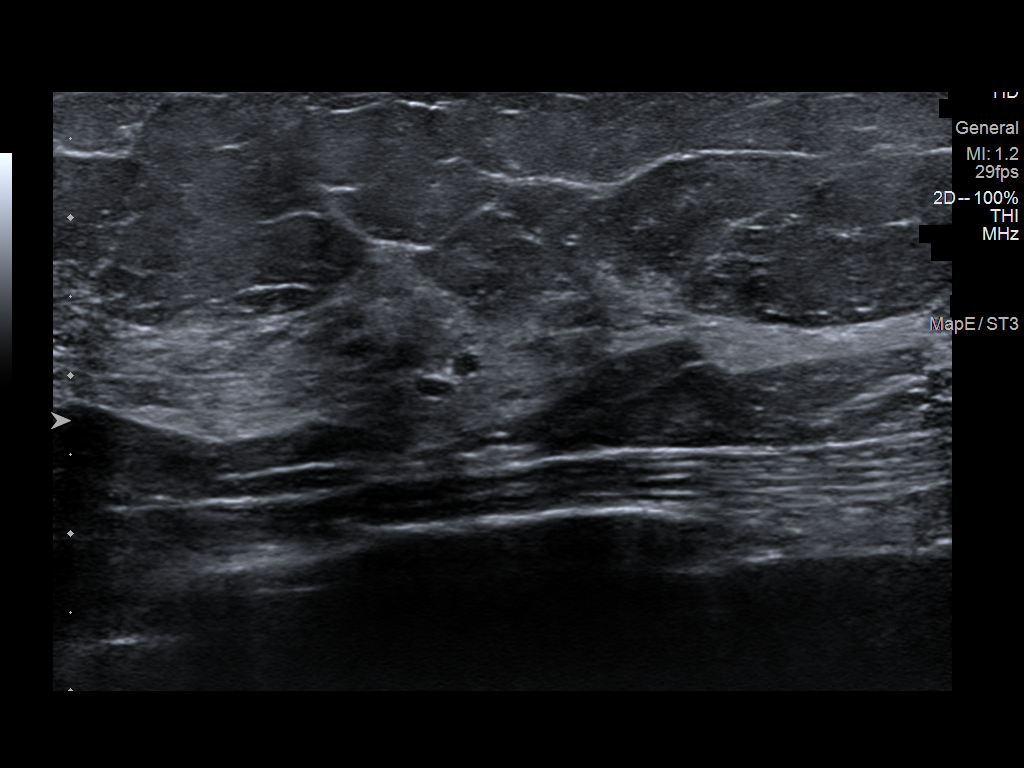

[6 of 6 positions shown; findings below may reference images not displayed]

ACR Breast Density Category b: There are scattered areas of
fibroglandular density.
FINDINGS: Right breast:

Mammogram: Additional 2-D and 3-D images are performed. These views
show no persistent asymmetry in the LATERAL aspect of the RIGHT
breast. Mammographic images were processed with CAD.

Left breast:

Mammogram: Additional 2-D and 3-D images are performed. These views
show persistent focal asymmetry on the craniocaudal projection.
Mammographic images were processed with CAD.

Targeted ultrasound is performed, showing a focally dilated duct in
the 3 o'clock location of the LEFT breast 3 centimeters from the
nipple accounting for the mammographic abnormality. No intraductal
mass acoustic shadowing identified.
IMPRESSION: No mammographic or ultrasound evidence for malignancy.

RECOMMENDATION:
Screening mammogram in one year.(Code:SG-Q-LSM)

I have discussed the findings and recommendations with the patient.
If applicable, a reminder letter will be sent to the patient
regarding the next appointment.

BI-RADS CATEGORY  2: Benign.

## 2020-11-26 IMAGING — MG DIGITAL DIAGNOSTIC BILAT W/ TOMO W/ CAD
8 series · 9 of 24 positions shown · non-contrast
Comparison: 09/23/2019 and earlier

CLINICAL DATA: Patient returns after screening for evaluation of
possible asymmetry seen in both breasts.

EXAM:
DIGITAL DIAGNOSTIC BILATERAL MAMMOGRAM WITH CAD AND TOMO
ULTRASOUND LEFT BREAST

[L ML synth-2D]
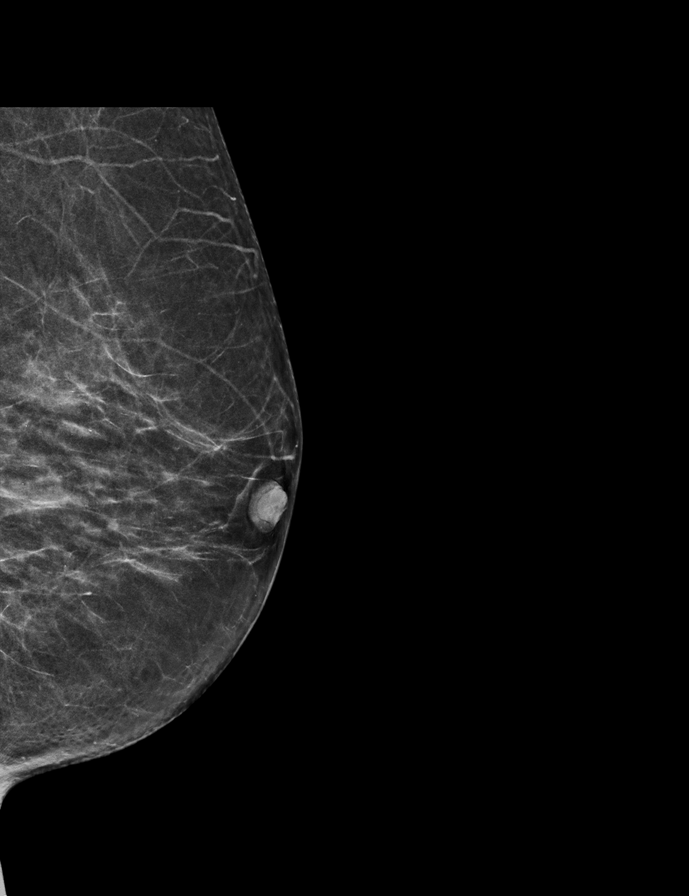

[L CC synth-2D]
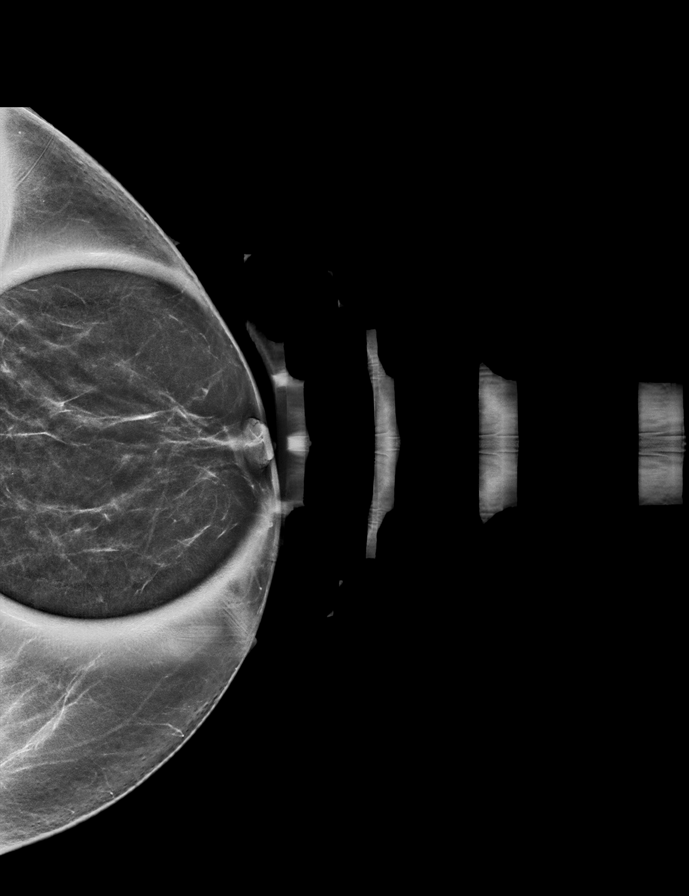

[R ML synth-2D]
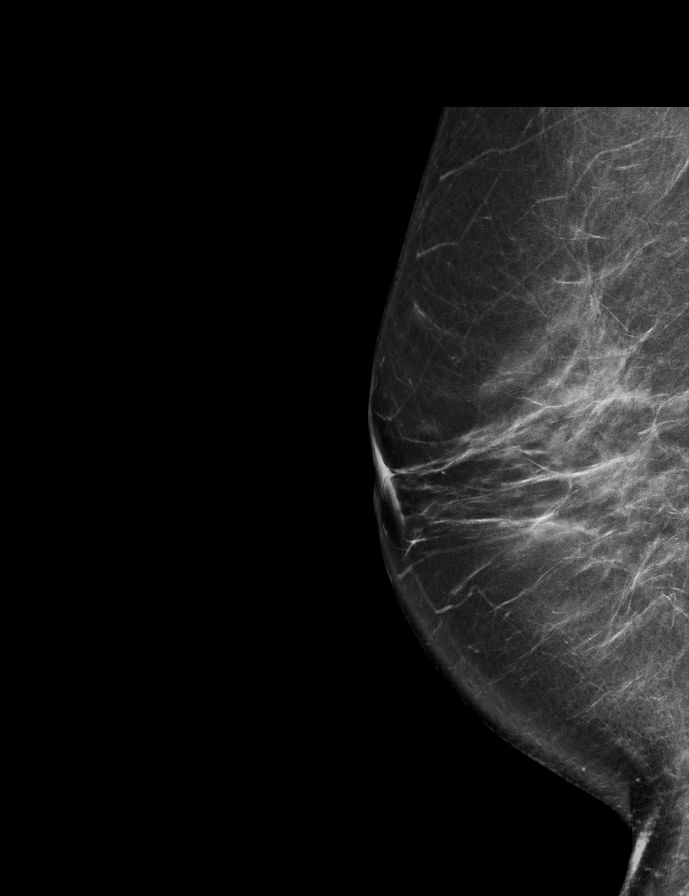

[R CC synth-2D]
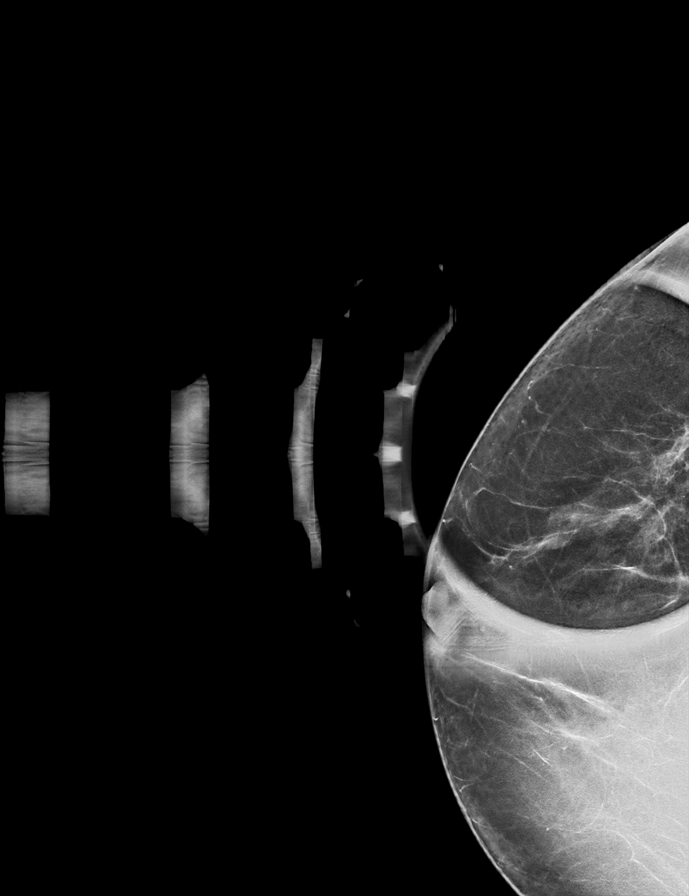

[L CC tomo · 2 of 59 frames shown]
[frame 20/59]
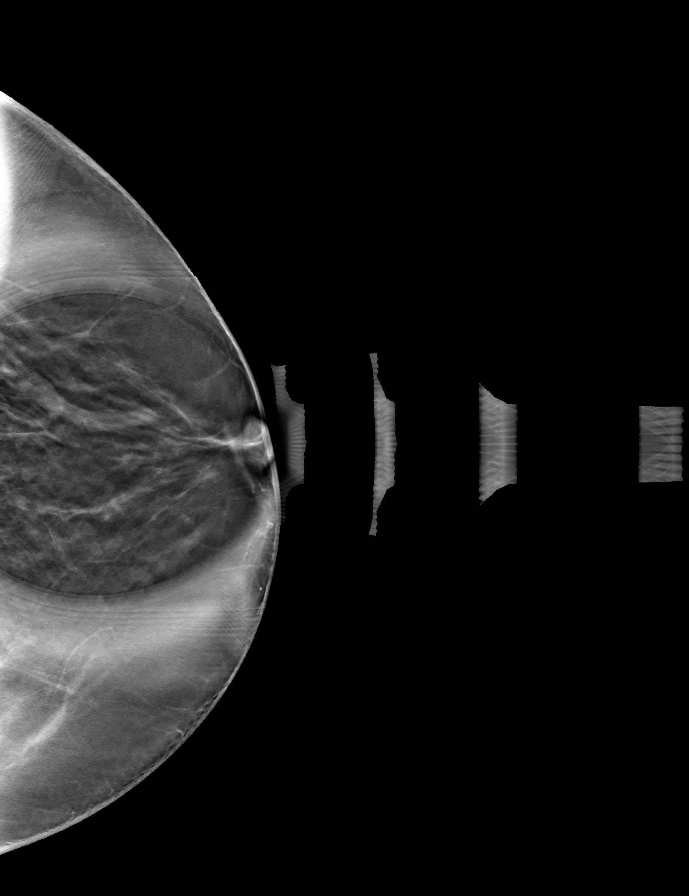
[frame 30/59]
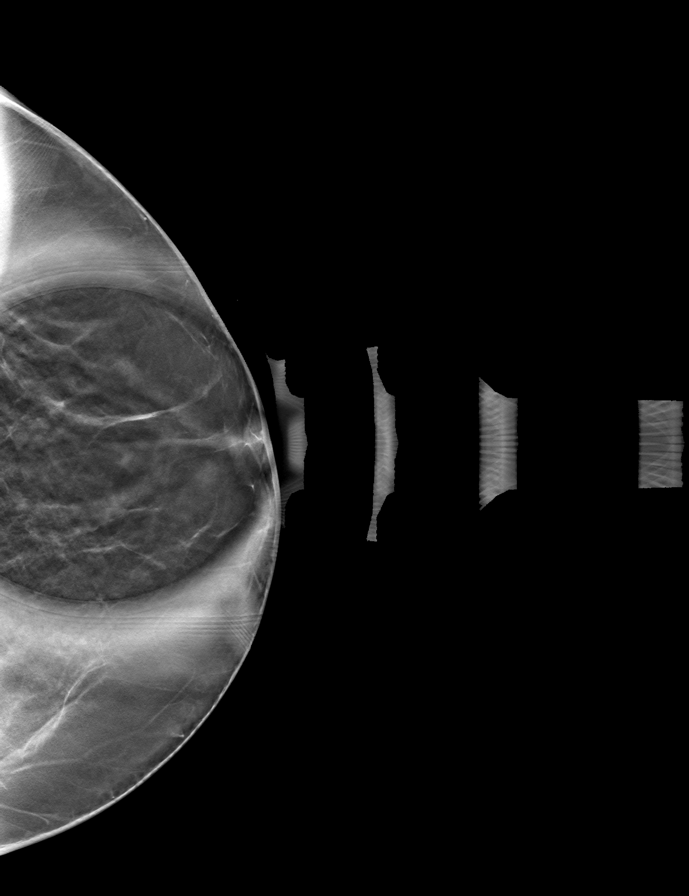

[R ML tomo · tomo slice 40/79.0]
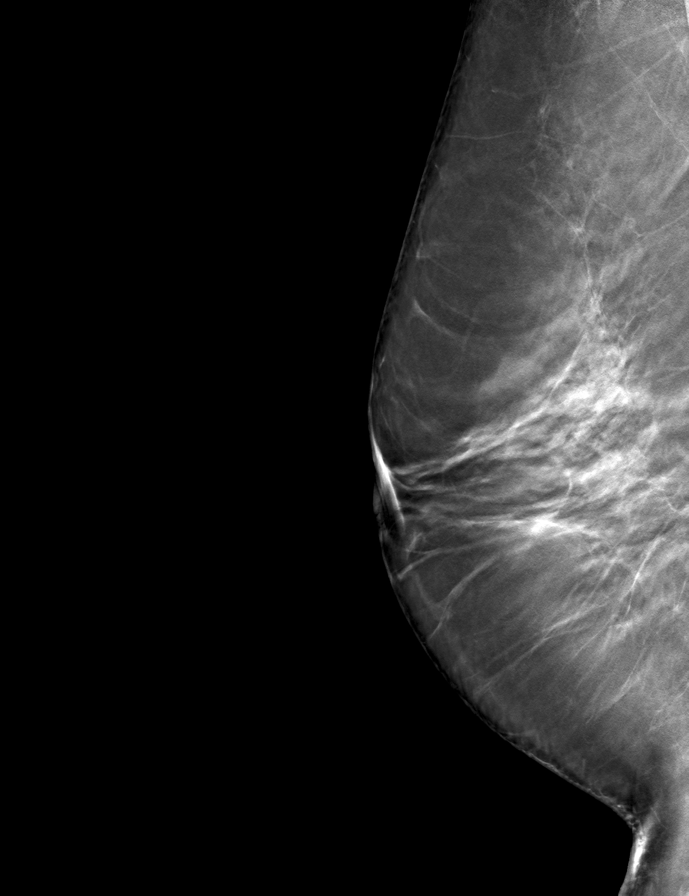

[L ML tomo · tomo slice 30/59.0]
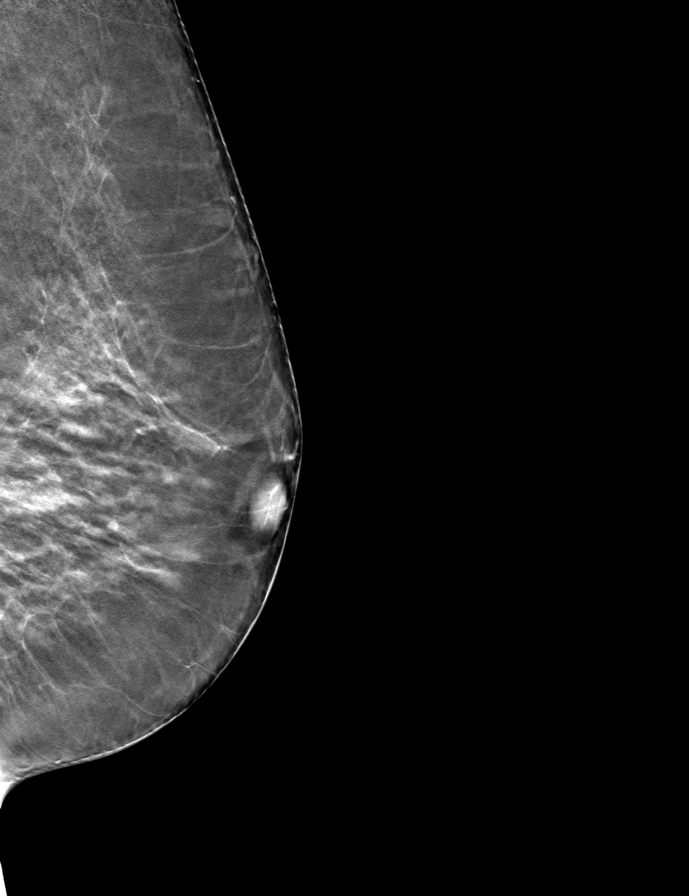

[R CC tomo · tomo slice 31/60.0]
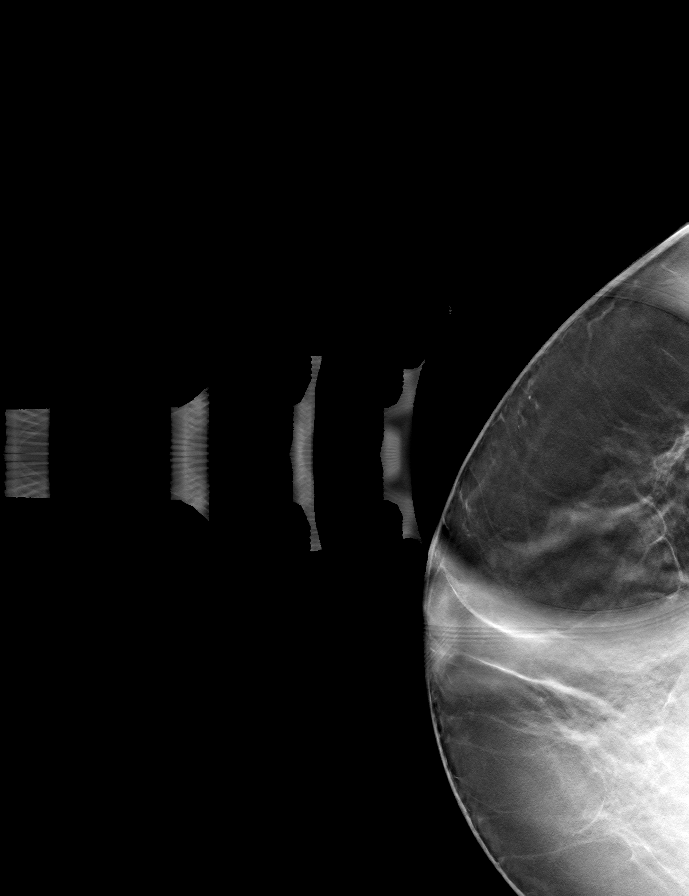

[9 of 24 positions shown; findings below may reference images not displayed]

ACR Breast Density Category b: There are scattered areas of
fibroglandular density.
FINDINGS: Right breast:

Mammogram: Additional 2-D and 3-D images are performed. These views
show no persistent asymmetry in the LATERAL aspect of the RIGHT
breast. Mammographic images were processed with CAD.

Left breast:

Mammogram: Additional 2-D and 3-D images are performed. These views
show persistent focal asymmetry on the craniocaudal projection.
Mammographic images were processed with CAD.

Targeted ultrasound is performed, showing a focally dilated duct in
the 3 o'clock location of the LEFT breast 3 centimeters from the
nipple accounting for the mammographic abnormality. No intraductal
mass acoustic shadowing identified.
IMPRESSION: No mammographic or ultrasound evidence for malignancy.

RECOMMENDATION:
Screening mammogram in one year.(Code:SG-Q-LSM)

I have discussed the findings and recommendations with the patient.
If applicable, a reminder letter will be sent to the patient
regarding the next appointment.

BI-RADS CATEGORY  2: Benign.

## 2020-11-27 ENCOUNTER — Encounter: Payer: Self-pay | Admitting: Gastroenterology

## 2020-11-28 ENCOUNTER — Encounter: Payer: Self-pay | Admitting: Obstetrics and Gynecology

## 2020-11-30 ENCOUNTER — Telehealth: Payer: Self-pay | Admitting: Obstetrics and Gynecology

## 2020-11-30 NOTE — Telephone Encounter (Signed)
Patient is requesting to be seen sooner than 12/27/20 as a new patient. Her PCP ordered a pelvic ultrasound, because the patient is having post menopausal bleeding and pain. The pelvic ultrasound is in her chart.

## 2020-12-04 NOTE — Telephone Encounter (Signed)
fyi  Spoke with pt, NP to [place] and scheduled for 3/17.  Hx of polyps and having pain on and off every few days. Denies bleeding at this time.  Is taking tylenol for the pain.  Wanted you to review her [place] and see if she needed to be seen sooner.  PCP had already reviewed [place] with PT.  Pt anxious about on and off uterine pain.  States tylenol does help.   Pt is okay with waiting until her scheduled appt.

## 2020-12-05 NOTE — Telephone Encounter (Addendum)
I think 3/17 is ok- 3 weeks.   Call if bleeding worsens.   She will need an EMB at that visit- please update the schedule to indicate that if it isn't already  Domenic Moras, MD   OB/GYN

## 2020-12-27 ENCOUNTER — Encounter: Payer: Self-pay | Admitting: Obstetrics and Gynecology

## 2020-12-27 ENCOUNTER — Encounter: Payer: Self-pay | Admitting: Gastroenterology

## 2020-12-27 ENCOUNTER — Ambulatory Visit
Payer: No Typology Code available for payment source | Attending: Obstetrics and Gynecology | Admitting: Obstetrics and Gynecology

## 2020-12-27 ENCOUNTER — Telehealth: Payer: Self-pay | Admitting: Obstetrics and Gynecology

## 2020-12-27 VITALS — BP 122/82 | Ht 64.0 in | Wt 164.0 lb

## 2020-12-27 DIAGNOSIS — N950 Postmenopausal bleeding: Secondary | ICD-10-CM | POA: Insufficient documentation

## 2020-12-27 DIAGNOSIS — Z01419 Encounter for gynecological examination (general) (routine) without abnormal findings: Secondary | ICD-10-CM

## 2020-12-27 NOTE — Telephone Encounter (Signed)
Patient has questions about her D&C. Patient wants to know if she should have an abdominal CT scan before her D&C? Patient also has other questions.

## 2020-12-27 NOTE — Progress Notes (Signed)
[facility]    CC:   Chief Complaint   Patient presents with    New Patient Visit     annual         HPI: 60 y.o. [id number] here for postmenopausal bleeding. She is new back to this area. Was in [place] prior. Moved here around May 2021. Had bleeding that started Feb 2021. Saw her GYN there and had a EMB in the office that was very traumatic for her. Per Ursula Beath showed polyp. She was supposed to schedule for D&C but moved up here. She then stopped bleeding. Had bleeding again that started a few months ago. Had an [place] here. She is nervous because she states her cervix is tilted and last biopsy was very painful.     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 She also is nervous that she had cancer and that it has metastasized. Has pain in lower abdomen that wraps around to her right side and right flank and upper right abdomen.     [place] 11/2020:  The endometrial stripe measures 13 mm in thickness which is abnormally thickened in a postmenopausal patient with a history of vaginal bleeding. The differential includes both endometrial hyperplasia or potentially neoplasia. No discrete mass lesion.      Unremarkable RIGHT ovary. The LEFT ovary is not clearly visualized.         END OF IMPRESSION         Past Medical History:   Diagnosis Date    Bladder infection     Blood transfusion affecting pregnancy 1982    GERD (gastroesophageal reflux disease)     High cholesterol     Kidney infection     Migraines        Past Surgical History:   Procedure Laterality Date    abdominalplasty  2007    and rectus repair    CESAREAN SECTION, CLASSIC      X3    CHOLECYSTECTOMY  1998    KNEE ARTHROSCOPY - LIGAMENT RECONSTRUCTION  1978    right    MENISCECTOMY  2001    right    tubal ligation         Social History     Socioeconomic History    Marital status: Single     Spouse name: Not on file    Number of children: Not on file    Years of education: Not on file    Highest education level: Not on file   Tobacco Use    Smoking status:  Never Smoker    Smokeless tobacco: Never Used   Substance and Sexual Activity    Alcohol use: Yes     Comment: occasionally    Drug use: Never    Sexual activity: Not Currently     Partners: Male   Other Topics Concern    Not on file   Social History Narrative    Not on file       Vitals:    12/27/20 1055   BP: 122/82   Weight: 74.4 kg (164 lb)   Height: 1.626 m (5\' 4" )       PE:  Gen-NAD  gu- normal external genitalia, normal vaginal mucosa, cervix very anterior and difficult to visualize, pt legs in mcroberts, unable to grasp anterior lip of cervix with tenaculum, 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

## 2020-12-27 NOTE — Telephone Encounter (Signed)
Reviewed note with pt, wanted to check with you if she needs to obtain another [place] before the [facility]  Also asking if you feel the need for a CT on abdomen.

## 2020-12-27 NOTE — Progress Notes (Signed)
Pt arrived alone for her NP exam. Pt new to the area and wanting to establish care.

## 2020-12-27 NOTE — Telephone Encounter (Signed)
She does not need an abd CT- as that will not change our management of the fact that she needs a d&c  The [place] that is ordered is for  The D&C- it will be ultrasound guided- so no, she doesn't need another one prior to her d&C

## 2020-12-27 NOTE — Telephone Encounter (Signed)
Okay! I figured I just wanted to check! Thanks! :)

## 2021-01-04 ENCOUNTER — Encounter: Payer: Self-pay | Admitting: Obstetrics and Gynecology

## 2021-01-04 LAB — GYN CYTOLOGY

## 2021-01-04 NOTE — Telephone Encounter (Signed)
Pt is calling to inquire more about the d&c, she has questions in regards to when it will be scheduled as well as what she may expect for recovery

## 2021-01-25 ENCOUNTER — Other Ambulatory Visit: Payer: Self-pay | Admitting: Obstetrics and Gynecology

## 2021-01-25 DIAGNOSIS — Z01818 Encounter for other preprocedural examination: Secondary | ICD-10-CM

## 2021-01-28 ENCOUNTER — Other Ambulatory Visit
Admission: RE | Admit: 2021-01-28 | Discharge: 2021-01-28 | Disposition: A | Discharge status: Home / Self Care | Payer: No Typology Code available for payment source | Source: Ambulatory Visit | Attending: Obstetrics and Gynecology | Admitting: Obstetrics and Gynecology

## 2021-01-28 DIAGNOSIS — Z20828 Contact with and (suspected) exposure to other viral communicable diseases: Secondary | ICD-10-CM | POA: Insufficient documentation

## 2021-01-28 DIAGNOSIS — Z20822 Contact with and (suspected) exposure to covid-19: Secondary | ICD-10-CM | POA: Insufficient documentation

## 2021-01-29 ENCOUNTER — Encounter: Payer: Self-pay | Admitting: Obstetrics and Gynecology

## 2021-01-29 LAB — COVID-19 PCR

## 2021-01-29 LAB — COVID-19 NAAT (PCR): COVID-19 NAAT (PCR): NEGATIVE

## 2021-01-31 NOTE — Anesthesia Preprocedure Evaluation (Addendum)
Anesthesia Pre-operative History and Physical for Michele Chandler  .  .  .  Anesthesia Evaluation Information Source: records, patient     ANESTHESIA HISTORY     Denies anesthesia history         PULMONARY  Pertinent(-):  No smoking, asthma or recent URI    CARDIOVASCULAR  Pertinent(-):  No hypertension or angina    GI/HEPATIC/RENAL   NPO: > 8hrs ago (solids) and > 2hrs ago (clears)     NEURO/PSYCH  Pertinent(-):  No seizures or cerebrovascular event    ENDO/OTHER  Pertinent(-):  No diabetes mellitus, thyroid disease             Physical Exam    Airway            Mouth opening: normal            Mallampati: II            Neck ROM: full  Dental   Normal Exam   Cardiovascular           Rhythm: regular           Rate: normal         Pulmonary     breath sounds clear to auscultation    Mental Status     oriented to person, place and time         ________________________________________________________________________  PLAN  ASA Score  2  Anesthetic Plan MAC       Induction (routine IV) General Anesthesia/Sedation Maintenance Plan (IV bolus); Airway (nasal cannula); Line ( use current access); Monitoring (standard ASA); Positioning (supine and lithotomy); PostOp (ASC)    Informed Consent     Responsible Anesthesia Provider Attestation:  I attest that the patient or proxy understands and accepts the risks and benefits of the anesthesia plan. I also attest that I have personally performed a pre-anesthetic examination and evaluation, and prescribed the anesthetic plan for this particular location within 48 hours prior to the anesthetic as documented. Tommi Rumps, MD  01/31/21, 7:13 PM

## 2021-02-01 ENCOUNTER — Encounter
Admission: RE | Disposition: A | Discharge status: Home / Self Care | Payer: Self-pay | Source: Ambulatory Visit | Attending: Obstetrics and Gynecology

## 2021-02-01 ENCOUNTER — Ambulatory Visit: Payer: No Typology Code available for payment source | Admitting: Anesthesiology

## 2021-02-01 ENCOUNTER — Ambulatory Visit
Admission: RE | Admit: 2021-02-01 | Discharge: 2021-02-01 | Disposition: A | Discharge status: Home / Self Care | Payer: No Typology Code available for payment source | Source: Ambulatory Visit | Attending: Obstetrics and Gynecology | Admitting: Obstetrics and Gynecology

## 2021-02-01 DIAGNOSIS — N840 Polyp of corpus uteri: Secondary | ICD-10-CM

## 2021-02-01 DIAGNOSIS — Z9889 Other specified postprocedural states: Secondary | ICD-10-CM

## 2021-02-01 DIAGNOSIS — N950 Postmenopausal bleeding: Secondary | ICD-10-CM | POA: Insufficient documentation

## 2021-02-01 HISTORY — PX: PR HYSTEROSCOPY,W/ENDO BX: 58558

## 2021-02-01 HISTORY — DX: Motion sickness, initial encounter: T75.3XXA

## 2021-02-01 HISTORY — PX: PR HYSTEROSCOPY BX ENDOMETRIUM&/POLYPC W/WO D&C: 58558

## 2021-02-01 SURGERY — HYSTEROSCOPY, WITH TISSUE REMOVAL, USING HYSTEROSCOPIC ROTATING CUTTER BLADE
Anesthesia: Monitor Anesthesia Care | Site: Uterus | Wound class: Clean Contaminated

## 2021-02-01 MED ORDER — ONDANSETRON HCL 2 MG/ML IV SOLN *I*
INTRAMUSCULAR | Status: DC | PRN
Start: 2021-02-01 — End: 2021-02-01
  Administered 2021-02-01: 4 mg via INTRAMUSCULAR

## 2021-02-01 MED ORDER — PROPOFOL 10 MG/ML IV EMUL (INTERMITTENT DOSING) WRAPPED *I*
INTRAVENOUS | Status: DC | PRN
Start: 2021-02-01 — End: 2021-02-01
  Administered 2021-02-01 (×3): 50 mg via INTRAVENOUS

## 2021-02-01 MED ORDER — IBUPROFEN 600 MG PO TABS *I*
600.0000 mg | ORAL_TABLET | Freq: Four times a day (QID) | ORAL | Status: DC | PRN
Start: 2021-02-02 — End: 2021-02-01

## 2021-02-01 MED ORDER — LIDOCAINE HCL 2 % (PF) IJ SOLN *I*
INTRAMUSCULAR | Status: AC
Start: 2021-02-01 — End: 2021-02-01
  Filled 2021-02-01: qty 5

## 2021-02-01 MED ORDER — ONDANSETRON HCL 2 MG/ML IV SOLN *I*
INTRAMUSCULAR | Status: AC
Start: 2021-02-01 — End: 2021-02-01
  Filled 2021-02-01: qty 2

## 2021-02-01 MED ORDER — LACTATED RINGERS IV SOLN *I*
125.0000 mL/h | INTRAVENOUS | Status: DC
Start: 2021-02-01 — End: 2021-02-01
  Administered 2021-02-01: 125 mL/h via INTRAVENOUS

## 2021-02-01 MED ORDER — LIDOCAINE HCL (PF) 1 % IJ SOLN *I*
INTRAMUSCULAR | Status: DC | PRN
Start: 2021-02-01 — End: 2021-02-01
  Administered 2021-02-01: 10 mL

## 2021-02-01 MED ORDER — ACETAMINOPHEN 325 MG PO TABS *I*
650.0000 mg | ORAL_TABLET | Freq: Four times a day (QID) | ORAL | Status: DC
Start: 2021-02-01 — End: 2021-02-01
  Administered 2021-02-01: 650 mg via ORAL

## 2021-02-01 MED ORDER — KETOROLAC TROMETHAMINE 30 MG/ML IJ SOLN *I*
INTRAMUSCULAR | Status: AC
Start: 2021-02-01 — End: 2021-02-01
  Filled 2021-02-01: qty 1

## 2021-02-01 MED ORDER — PROPOFOL 10 MG/ML IV EMUL (INTERMITTENT DOSING) WRAPPED *I*
INTRAVENOUS | Status: AC
Start: 2021-02-01 — End: 2021-02-01
  Filled 2021-02-01: qty 20

## 2021-02-01 MED ORDER — KETOROLAC TROMETHAMINE 30 MG/ML IJ SOLN *I*
30.0000 mg | Freq: Four times a day (QID) | INTRAMUSCULAR | Status: DC
Start: 2021-02-01 — End: 2021-02-01
  Administered 2021-02-01: 30 mg via INTRAVENOUS

## 2021-02-01 MED ORDER — LIDOCAINE HCL 2 % IJ SOLN *I*
INTRAMUSCULAR | Status: DC | PRN
Start: 2021-02-01 — End: 2021-02-01
  Administered 2021-02-01: 80 mg via INTRAVENOUS

## 2021-02-01 MED ORDER — DEXAMETHASONE SODIUM PHOSPHATE 10 MG/ML IJ SOLN *I*
INTRAMUSCULAR | Status: AC
Start: 2021-02-01 — End: 2021-02-01
  Filled 2021-02-01: qty 1

## 2021-02-01 MED ORDER — MIDAZOLAM HCL 1 MG/ML IJ SOLN *I* WRAPPED
INTRAMUSCULAR | Status: AC
Start: 2021-02-01 — End: 2021-02-01
  Filled 2021-02-01: qty 2

## 2021-02-01 MED ORDER — FENTANYL CITRATE 50 MCG/ML IJ SOLN *WRAPPED*
INTRAMUSCULAR | Status: DC | PRN
Start: 2021-02-01 — End: 2021-02-01
  Administered 2021-02-01: 100 ug via INTRAVENOUS

## 2021-02-01 MED ORDER — FENTANYL CITRATE 50 MCG/ML IJ SOLN *WRAPPED*
INTRAMUSCULAR | Status: AC
Start: 2021-02-01 — End: 2021-02-01
  Filled 2021-02-01: qty 2

## 2021-02-01 MED ORDER — ACETAMINOPHEN 325 MG PO TABS *I*
ORAL_TABLET | ORAL | Status: AC
Start: 2021-02-01 — End: 2021-02-01
  Filled 2021-02-01: qty 2

## 2021-02-01 MED ORDER — MIDAZOLAM HCL 1 MG/ML IJ SOLN *I* WRAPPED
INTRAMUSCULAR | Status: DC | PRN
Start: 2021-02-01 — End: 2021-02-01
  Administered 2021-02-01: 2 mg via INTRAVENOUS

## 2021-02-01 MED ORDER — DEXAMETHASONE SODIUM PHOSPHATE 4 MG/ML INJ SOLN *WRAPPED*
INTRAMUSCULAR | Status: DC | PRN
Start: 2021-02-01 — End: 2021-02-01
  Administered 2021-02-01: 8 mg via INTRAVENOUS

## 2021-02-01 SURGICAL SUPPLY — 14 items
ACCESSORY WASTE MANAGEMENT W CAP FOR AVETA SYSTEM (Other) ×2 IMPLANT
AVETA CORAL HYSTEROSCOPE DISPO 4.6MM (Supply) ×2 IMPLANT
AVETA FLUID MANAGEMENT ACCESSORY (Supply) ×3 IMPLANT
AVETA RESECTING DEVICE DISPO FLEX 2.9MM (Supply) ×2 IMPLANT
GARMENT COMPR M CALF BLU SEQ SCD REPROC FOR DVT VASO-FORC (Supply) ×1 IMPLANT
GLOVE BIOGEL PI MICRO IND UNDER SZ 6.0 LF (Glove) ×5 IMPLANT
GLOVE SURG BIOGEL PI ULTRATOUCH SZ 6.0 (Glove) ×5 IMPLANT
HANDLE YANK SUCT OPEN TIP (Supply) ×3 IMPLANT
NEEDLE QUINCKE SPI 20G X 3.5IN (Needle) ×3 IMPLANT
PACK CUSTOM DOUBLE BASIN (Pack) ×3 IMPLANT
PACK DANDC HYSTEROSCOPY (Pack) ×3 IMPLANT
SLEEVE SCD DVT MED (Supply) ×2
SOL SOD CHL IRRIG .9PCT 3000ML BAG (Solution) ×3 IMPLANT
SYRINGE CONTROL 10CC (Syringe) ×3 IMPLANT

## 2021-02-01 NOTE — Discharge Instructions (Signed)
DISCHARGE INSTRUCTIONS  Hysteroscopy, D&C     02/01/2021     You have received sedative medication and/or general anesthesia which may make you drowsy for as long as 24 hours:     A)  DO NOT drive or operate any machinery for 24 hours     B)  DO NOT drink alcoholic beverages for 24 hours     C)  DO NOT make major decisions, sign contracts, etc for 24 hours  Please continue to adhere to these precautions if you are taking narcotic medication.    Discomfort after surgery:  - It is normal to have mild to moderate abdominal cramping for a few days to a few weeks  - Use acetaminophen (Tylenol) or ibuprofen for pain unless you are allergic to these drugs    Surgery site care after surgery  - It is normal to have some vaginal bleeding for up to 2 weeks after surgery.    Activity after surgery  - You should rest the day of the procedure  - You can resume normal activities the day following your procedure  - Shower: You may shower  - Driving: you may drive after 24 hours  - Exercise: Do not resume vigorous exercise for 1 week.  - Intercourse: Nothing in the vagina (no tampons, douching or intercourse) for 4 weeks or until bleeding/discharge has resolved    Diet after surgery  - You may feel nauseated from the surgery or anesthesia.  Advance diet as tolerated.    Medications  See medication reconciliation sheet    When to call your doctor:  - fever greater than 100 degrees F and/or chills  - Nausea and vomiting  - inability to urinate  - severe cramping or abdominal pain not relieved by acetaminophen (Tylenol) or ibuprofen  - foul smelling discharge  - heavy vaginal bleeding (soaking through 1 pad every 1-2 hours)  - With any other concerns or questions  - If you have an emergency and are unable to contact your doctor, you may call the Emergency Department

## 2021-02-01 NOTE — Anesthesia Procedure Notes (Signed)
---------------------------------------------------------------------------------------------------------------------------------------    AIRWAY   GENERAL INFORMATION AND STAFF    Patient location during procedure: OR  CONDITION PRIOR TO MANIPULATION     Current Airway/Neck Condition:  Normal        For more airway physical exam details, see Anesthesia PreOp Evaluation  AIRWAY METHOD     Patient Position:  Sniffing    Preoxygenated: yes      Induction: IV  FINAL AIRWAY DETAILS    Final Airway Type:  Nasal cannula    Head position required to avoid obstruction:  Neutral    Insertion Site:  Left naris and right naris  ----------------------------------------------------------------------------------------------------------------------------------------

## 2021-02-01 NOTE — Anesthesia Postprocedure Evaluation (Signed)
Anesthesia Post-Op Note    Patient: Michele Chandler    Procedure(s) Performed:  Procedure Summary  Date:  02/01/2021 Anesthesia Start: 02/01/2021 10:19 AM Anesthesia Stop: 02/01/2021 10:56 AM Room / Location:  [id number] MAIN OR   Procedure(s):  HYSTEROSCOPY, WITH TISSUE REMOVAL, USING HYSTEROSCOPIC ROTATING CUTTER BLADE, DILATION AND CURETTAGE WITH AVETA Diagnosis:  Post-menopausal bleeding [N95.0] Surgeon(s):  Lovena Neighbours, MD Responsible Anesthesia Provider:  Tommi Rumps, MD         Recovery Vitals  BP: 149/78 (02/01/2021  9:54 AM)  Heart Rate: 80 (02/01/2021  9:54 AM)  Resp: 16 (02/01/2021  9:54 AM)  Temp: 36.1 C (97 F) (02/01/2021  9:54 AM)  SpO2: 98 % (02/01/2021  9:54 AM)    Anesthesia type:  MAC  Complications Noted During Procedure or in PACU:  None   Comment:    Patient Location:  ASC  Level of Consciousness:    Recovered to baseline  Patient Participation:     Able to participate  Temperature Status:    Normothermic  Oxygen Saturation:    Within patient's normal range  Cardiac Status:   Within patient's normal range  Fluid Status:    Stable  Airway Patency:     Yes  Pulmonary Status:    Baseline  Pain Management:    Adequate analgesia  Nausea and Vomiting:  None    Post Op Assessment:    Tolerated procedure well  Responsible Anesthesia Provider Attestation:  All indicated post anesthesia care provided       -

## 2021-02-01 NOTE — H&P (Signed)
HPI: 60 y.o. [id number] here for postmenopausal bleeding. She is new back to this area. Was in [place] prior. Moved here around May 2021. Had bleeding that started Feb 2021. Saw her GYN there and had a EMB in the office that was very traumatic for her. Per Ursula Beath showed polyp. She was supposed to schedule for D&C but moved up here. She then stopped bleeding. Had bleeding again that started a few months ago. Had an [place] here. She is nervous because she states her cervix is tilted and last biopsy was very painful.     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 She also is nervous that she had cancer and that it has metastasized. Has pain in lower abdomen that wraps around to her right side and right flank and upper right abdomen.     [place] 11/2020:  The endometrial stripe measures 13 mm in thickness which is abnormally thickened in a postmenopausal patient with a history of vaginal bleeding. The differential includes both endometrial hyperplasia or potentially neoplasia. No discrete mass lesion.      Unremarkable RIGHT ovary. The LEFT ovary is not clearly visualized.         END OF IMPRESSION              Past Medical History:   Diagnosis Date    Bladder infection     Blood transfusion affecting pregnancy 1982    GERD (gastroesophageal reflux disease)     High cholesterol     Kidney infection     Migraines              Past Surgical History:   Procedure Laterality Date    abdominalplasty  2007    and rectus repair    CESAREAN SECTION, CLASSIC      X3    CHOLECYSTECTOMY  1998    KNEE ARTHROSCOPY - LIGAMENT RECONSTRUCTION  1978    right    MENISCECTOMY  2001    right    tubal ligation         Social History           Socioeconomic History    Marital status: Single     Spouse name: Not on file    Number of children: Not on file    Years of education: Not on file    Highest education level: Not on file   Tobacco Use    Smoking status: Never Smoker    Smokeless tobacco: Never Used    Substance and Sexual Activity    Alcohol use: Yes     Comment: occasionally    Drug use: Never    Sexual activity: Not Currently     Partners: Male   Other Topics Concern    Not on file   Social History Narrative    Not on file           Vitals:    12/27/20 1055   BP: 122/82   Weight: 74.4 kg (164 lb)   Height: 1.626 m (5\' 4" )       PE:  Gen-NAD  gu- normal external genitalia, normal vaginal mucosa, cervix very anterior and difficult to visualize, pt legs in mcroberts, unable to grasp anterior lip of cervix with tenaculum, external cervical os is stenotic     A/P:  #1-PMB  hysteroscopy, D&C today   -written consent today  -reviewed pre and postop expectations     , MD   OB/GYN

## 2021-02-01 NOTE — Anesthesia Case Conclusion (Signed)
CASE CONCLUSION  Emergence  Criteria Used for Airway Removal:  Adequate Tv & RR  Assessment:  Routine  Transport  Directly to: ASC  Position:  Recumbent  Patient Condition on Handoff  Level of Consciousness:  Alert/talking/calm  Patient Condition:  Stable  Handoff Report to:  RN

## 2021-02-01 NOTE — Op Note (Signed)
Operative Note (Surgical Case/Log ID: [id number])       Date of Surgery: 02/01/2021       Surgeons: Moishe Spice) and Role:     * Domenic Moras, MD - Primary       Pre-op Diagnosis: Pre-Op Diagnosis Codes:     * Post-menopausal bleeding [N95.0]       Post-op Diagnosis: Post-Op Diagnosis Codes:     * Post-menopausal bleeding [N95.0]       Procedure(s) Performed: Procedure(s) (LRB):  HYSTEROSCOPY, WITH TISSUE REMOVAL, USING HYSTEROSCOPIC ROTATING CUTTER BLADE, DILATION AND CURETTAGE WITH AVETA (N/A)       Anesthesia Type: Monitor Anesthesia Care        Fluid Totals: I/O this shift:  04/22 0700 - 04/22 1459  In: 500 [I.V.:500]  Out: -   Net: 500       Estimated Blood Loss: 5 cc        Specimens to Pathology:  ID Type Source Tests Collected by Time Destination   A : Endometrial curettings TISSUE Tissue SURGICAL PATHOLOGY Domenic Moras, MD 02/01/2021 1045           Temporary Implants:        Packing:             Transplant: N/A   Patient Condition: good       Indications: Postmenopausal bleeding        Findings (Including unexpected complications): Cervix is flush with vagina. Uterus is very anterior. Hysteroscopy revealed endometrial polyp filling most of uterine cavity. Normal bilateral tubal ostia.      Description of Procedure:  The patient was taken to the operating room and stop check was performed confirming the patient and procedure to be completed.  Anesthesia was administered without difficulty.  The patient was positioned in dorsal lithotomy position, prepped and draped in the usual sterile fashion.  The surgical pause was completed.     Two handheld retractors were placed in the patient's vagina, the cervix was visualized and a single tooth tenaculum was applied to the anterior lip of the cervix at 12 o'clock.  A cervical block was placed with 10cc of 1% lidocaine at 3 and 9 o'clock. The cervix was progressively dilated to a 16 Hank  dilator.   The uterus was gently sounded to 10cm.   The hysteroscope was  inserted into the cervical canal and advanced with visualization of the uterine body, fundus and both tubal ostia.  An endometrial polyp was noted. Pictures were taken to document findings.  The hysteroscopic morcellator was prepared and primed according to the manufacturer's instructions.  The morcellator was then advanced through the operative channel into the uterine cavity and the polyp was successfully removed using morcellation.  Pictures were taken to document satisfactory results.  The morecellator and hysteroscope were removed with minimal bleeding noted. A blunt curettage was then performed.  The tenaculum was removed with good hemostasis noted.  The specimen was labeled and sent to pathology.    All sponge, needle and instrument counts were correct at the end of the procedure.  The patient tolerated the procedure well, anesthesia was reversed and she was taken to the Post Anesthesia Care Unit in stable and satisfactory condition.      Domenic Moras, MD   OB/GYN           Signed:  Domenic Moras, MD  on 02/01/2021 at 10:58 AM

## 2021-02-04 ENCOUNTER — Encounter: Payer: Self-pay | Admitting: Obstetrics and Gynecology

## 2021-02-07 LAB — SURGICAL PATHOLOGY

## 2021-02-08 ENCOUNTER — Telehealth: Payer: Self-pay | Admitting: Obstetrics and Gynecology

## 2021-02-08 NOTE — Telephone Encounter (Signed)
Can you let her know that her path from [facility] showed benign endometrial polyp tissue  F/u as scheduled  Domenic Moras, MD   OB/GYN

## 2021-02-13 ENCOUNTER — Telehealth: Payer: Self-pay | Admitting: Obstetrics and Gynecology

## 2021-02-13 NOTE — Telephone Encounter (Signed)
Telemed is fine- alp

## 2021-02-13 NOTE — Telephone Encounter (Signed)
This pt had a  hysteroscopy 02/01/2021. She is unable to make the f/u appt on 02/27/2021 inperson but could do a telemed at the same time. Are you agreeable with this?

## 2021-02-13 NOTE — Telephone Encounter (Addendum)
Coming in for a post op visit on 5/18. Patient has a disabled daughter that she would have to bring to appt and 8:45 am is too early.  Could not find anything and offered her.  Had a 3:00 pm  ([facility]) on 5/26 (Thursday) if I that would be ok?  Have not put anything there yet.  Can you let me know ASAP as the slots get taken quickly.  Pt did say that date/time worked for her.  TY

## 2021-02-27 ENCOUNTER — Ambulatory Visit
Payer: No Typology Code available for payment source | Attending: Obstetrics and Gynecology | Admitting: Obstetrics and Gynecology

## 2021-02-27 ENCOUNTER — Encounter: Payer: Self-pay | Admitting: Obstetrics and Gynecology

## 2021-02-27 ENCOUNTER — Telehealth: Payer: Self-pay | Admitting: Obstetrics and Gynecology

## 2021-02-27 DIAGNOSIS — N950 Postmenopausal bleeding: Secondary | ICD-10-CM | POA: Insufficient documentation

## 2021-02-27 DIAGNOSIS — Z1231 Encounter for screening mammogram for malignant neoplasm of breast: Secondary | ICD-10-CM | POA: Insufficient documentation

## 2021-02-27 NOTE — Telephone Encounter (Signed)
Pt was called this morning, however no answer. VM was left advising pt to call into office and get scheduled for an annual with ALP next March.

## 2021-02-27 NOTE — Progress Notes (Signed)
Reason for visit: Follow-up      [facility]    Chief Complaint:     Follow-up    Subjective:     Michele Chandler is a 60 y.o. female [phone] who is presenting by telemedicine for follow up of hysteroscopy, D&C after PMB. Feeling good since 4/22. No gyn concerns.    02/01/21:  [id number]     FINAL DIAGNOSIS:   Endometrial curetting:   -Endometrial polyp.   -Weakly proliferative type endometrium with foci glandular crowding.   -Superficial myometrium present.   -Fragments of squamous and endocervical mucosa with no dysplasia   identified.        Patient's medications, allergies, past medical, surgical, social and family histories were recorded, reviewed and updated as appropriate.    History     Past Medical History:   Diagnosis Date    Bladder infection     Blood transfusion affecting pregnancy 1982    GERD (gastroesophageal reflux disease)     High cholesterol     Kidney infection     Migraines     Motion sickness      Social History     Tobacco Use    Smoking status: Never Smoker    Smokeless tobacco: Never Used   Substance Use Topics    Alcohol use: Yes     Comment: occasionally       Past Surgical History:   Procedure Laterality Date    abdominalplasty  2007    and rectus repair    CESAREAN SECTION, CLASSIC      X3    CHOLECYSTECTOMY  1998    KNEE ARTHROSCOPY - LIGAMENT RECONSTRUCTION  1978    right    MENISCECTOMY  2001    right    PR HYSTEROSCOPY,W/ENDO BX N/A 02/01/2021    Procedure: HYSTEROSCOPY, WITH TISSUE REMOVAL, USING HYSTEROSCOPIC ROTATING CUTTER BLADE, DILATION AND CURETTAGE WITH AVETA;  Surgeon: Domenic Moras, MD;  Location: [facility];  Service: OBGYN    tubal ligation       Gynecologic History  Menstrual Status: Postmenopausal    Hormone Replacement Therapy: Never Used    Menses: N/A    Pap History: History of abnormal    Sexually Transmitted Infection History: HSV    HPV Vaccine Series: Not received        History reviewed. No pertinent family history.  OB History     Gravida    6    Para   4    Term   1    Preterm   3    AB   2    Living   4       SAB   2    IAB        Ectopic        Multiple        Live Births   4                Current Outpatient Medications   Medication Sig    valACYclovir (VALTREX) 500 mg tablet every 24 hours (Patient not taking: Reported on 01/29/2021)    calcium citrate-vitamin D (CITRACAL+D) 315-250 mg-unit per tablet Take 2 tablets by mouth 2 times daily    zinc sulfate (ZINCATE) 220 (50 Zn) MG capsule Take 220 mg by mouth daily    esomeprazole (NEXIUM) 20 mg capsule Take 20 mg by mouth as needed    Aspirin-Acetaminophen-Caffeine (EXCEDRIN MIGRAINE PO) Take by mouth as needed  Allergies   Allergen Reactions    Morphine Nausea And Vomiting          Objective:     A&Ox3  Communicates effectively       Assessment & Plan:      PMB  -reviewed pathology  -reviewed postop expectations  -call with any further bleeding    Screening  -ordered mammo- so she can get set up in this area    F/u- 1 year for annual      Consent was obtained from the patient to complete this video visit; including the potential for financial liability.     Domenic Moras, MD   OB/GYN

## 2021-02-27 NOTE — Telephone Encounter (Signed)
-----   Message from Domenic Moras, MD sent at 02/27/2021  9:27 AM EDT -----  Regarding: f/u  Can she have an annual on the books for ~12/2021 thanks- alp

## 2021-03-01 NOTE — Telephone Encounter (Signed)
Pt was called this morning and has been scheduled for March 24th, 2022 at 3:40pm. Pt is aware of time, date, and location.

## 2021-07-31 ENCOUNTER — Other Ambulatory Visit
Admission: RE | Admit: 2021-07-31 | Discharge: 2021-07-31 | Disposition: A | Discharge status: Home / Self Care | Payer: No Typology Code available for payment source | Source: Ambulatory Visit | Attending: Family Medicine | Admitting: Family Medicine

## 2021-07-31 DIAGNOSIS — E785 Hyperlipidemia, unspecified: Secondary | ICD-10-CM | POA: Insufficient documentation

## 2021-07-31 LAB — COMPREHENSIVE METABOLIC PANEL
ALT: 16 U/L (ref 0–35)
AST: 22 U/L (ref 0–35)
Albumin: 4.5 g/dL (ref 3.5–5.2)
Alk Phos: 92 U/L (ref 35–105)
Anion Gap: 9 (ref 7–16)
Bilirubin,Total: 0.4 mg/dL (ref 0.0–1.2)
CO2: 26 mmol/L (ref 20–28)
Calcium: 9.4 mg/dL (ref 8.6–10.2)
Chloride: 103 mmol/L (ref 96–108)
Creatinine: 0.89 mg/dL (ref 0.51–0.95)
Glucose: 95 mg/dL (ref 60–99)
Lab: 15 mg/dL (ref 6–20)
Potassium: 4.6 mmol/L (ref 3.4–4.7)
Sodium: 138 mmol/L (ref 133–145)
Total Protein: 7.1 g/dL (ref 6.3–7.7)
eGFR BY CREAT: 74 *

## 2021-07-31 LAB — LIPID PANEL
Chol/HDL Ratio: 3.5
Cholesterol: 218 mg/dL — AB
HDL: 62 mg/dL — ABNORMAL HIGH (ref 40–60)
LDL Calculated: 133 mg/dL — AB
Non HDL Cholesterol: 156 mg/dL
Triglycerides: 116 mg/dL

## 2022-01-03 ENCOUNTER — Ambulatory Visit: Payer: No Typology Code available for payment source | Admitting: Obstetrics and Gynecology

## 2022-02-25 ENCOUNTER — Encounter: Payer: Self-pay | Admitting: Obstetrics and Gynecology

## 2022-02-26 ENCOUNTER — Telehealth: Payer: Self-pay | Admitting: Obstetrics and Gynecology

## 2022-02-26 NOTE — Telephone Encounter (Signed)
Is that necessary? Is she able to schedule it before the order expires or does it need to be performed before the order expires?

## 2022-02-26 NOTE — Telephone Encounter (Signed)
Pt called into office to reschedule appointment also asking if new order could be placed for mammo. Will not be able to go before current expires. Pt not coming home until after June 15 from trip.

## 2022-02-26 NOTE — Telephone Encounter (Signed)
Writer called [facility] to inquire about patient request. If the order is for standard screening pt does not HAVE to have an order - they can create order when pt calls. However, if patient needs a diagnostic mammo order is required and imaging must be completed prior to expiration date. Pt notified and aware to call when she is available.

## 2022-06-19 ENCOUNTER — Ambulatory Visit: Payer: No Typology Code available for payment source | Admitting: Obstetrics and Gynecology

## 2022-07-25 ENCOUNTER — Other Ambulatory Visit: Payer: Self-pay

## 2022-07-25 ENCOUNTER — Ambulatory Visit: Payer: Medicaid Other | Admitting: Obstetrics and Gynecology

## 2022-07-25 ENCOUNTER — Encounter: Payer: Self-pay | Admitting: Obstetrics and Gynecology

## 2022-07-25 VITALS — BP 116/78 | Ht 64.0 in | Wt 159.0 lb

## 2022-07-25 DIAGNOSIS — Z01419 Encounter for gynecological examination (general) (routine) without abnormal findings: Secondary | ICD-10-CM

## 2022-07-25 NOTE — Progress Notes (Signed)
[facility]  Annual Gynecologic Exam    CC: Annual GYN appointment    HPI: Michele Chandler is a 61 y.o. [id number] female who presents to clinic today for annual exam. She is doing well. Has hx of    PMB last year. No further bleeding or pelvic pain. No gyn concerns. She was previously a  Therapist, music.       PAST MEDICAL HISTORY  Past Medical History:   Diagnosis Date    Bladder infection     Blood transfusion affecting pregnancy 1982    GERD (gastroesophageal reflux disease)     High cholesterol     Kidney infection     Migraines     Motion sickness         PAST SURGICAL HISTORY  Past Surgical History:   Procedure Laterality Date    abdominalplasty  2007    and rectus repair    CESAREAN SECTION, CLASSIC      X3    CHOLECYSTECTOMY  1998    KNEE ARTHROSCOPY - LIGAMENT RECONSTRUCTION  1978    right    MENISCECTOMY  2001    right    PR HYSTEROSCOPY BX ENDOMETRIUM&/POLYPC W/WO D&C N/A 02/01/2021    Procedure: HYSTEROSCOPY, WITH TISSUE REMOVAL, USING HYSTEROSCOPIC ROTATING CUTTER BLADE, DILATION AND CURETTAGE WITH AVETA;  Surgeon: Domenic Moras, MD;  Location: [facility];  Service: OBGYN    tubal ligation          FAMILY HISTORY  Family History   Problem Relation Age of Onset    Alzheimer's disease Mother     Cerebral aneurysm Father     Diabetes Sister     High Blood Pressure Brother     Breast cancer Neg Hx     Colon cancer Neg Hx     Cervical cancer Neg Hx     Ovarian cancer Neg Hx     Uterine cancer Neg Hx         MEDICATIONS  Prior to Admission medications    Medication Sig Start Date End Date Taking? Authorizing Provider   calcium citrate-vitamin D (CITRACAL+D) 315-250 mg-unit per tablet Take 2 tablets by mouth 2 times daily   Yes [provider]   zinc sulfate (ZINCATE) 220 (50 Zn) MG capsule Take 1 capsule (220 mg total) by mouth daily   Yes [provider]   Aspirin-Acetaminophen-Caffeine (EXCEDRIN MIGRAINE PO) Take by mouth as needed    [provider]        ALLERGIES  Allergies    Allergen Reactions    Morphine Nausea And Vomiting        OBSTETRIC HISTORY  OB History   Gravida Para Term Preterm AB Living   6 4 1 3 2 4    SAB IAB Ectopic Multiple Live Births   2       4      # Outcome Date GA Lbr Len/2nd Weight Sex Delivery Anes PTL Lv   6 Term 1998 [redacted]w[redacted]d   M CS-LTranv      5 Preterm 1995 [redacted]w[redacted]d    CS-LTranv      4 Preterm 1982 [redacted]w[redacted]d    CS-LTranv         Complications: Placenta Previa   3 Preterm 1982 [redacted]w[redacted]d    CS-LTranv         Complications: Placenta Previa   2 SAB            1 SAB  GYNECOLOGIC HISTORY  LMP: No LMP recorded. Patient is postmenopausal.     SOCIAL HISTORY  Social History     Socioeconomic History    Marital status: Single   Tobacco Use    Smoking status: Never    Smokeless tobacco: Never   Substance and Sexual Activity    Alcohol use: Yes     Comment: occasionally    Drug use: Never    Sexual activity: Yes     Partners: Male     Birth control/protection: Post-menopausal           REVIEW OF SYSTEMS    Negative except for noted in HPI       PHYSICAL EXAM  Vitals:    07/25/22 1505   BP: 116/78   Weight: 72.1 kg (159 lb)   Height: 1.626 m (5\' 4" )       General - alert, well appearing, and in no distress  HEENT - Normocepahlic, atraumatic.   Resp - clear to auscultation bilaterally and Normal respiratory effort.  CV - Normal rate, regular rhythm  Breast - Symmetric, normal contour, skin normal, no palpable masses or nodules, nipples everted without discharge, no palpable axillary lymphadenopathy.   Abdomen - Soft, nondistended, nontender   Pelvic - Normal-appearing external genitalia, vagina and cervix. Uterus with normal contour, no adnexal tenderness or fullness appreciated.  Physiologic vaginal discharge.  No CMT.  Extremities - No edema noted   Female chaperone present for entire exam.     ASSESSMENT AND PLAN  61 y.o. [id number] female presents for annual GYN exam.        GYN SCREENING:    - last pap: normal/neg 12/2020     PREVENTATIVE HEALTH:    - mammo: encouraged  to schedule- over due   - colonoscopy 2022           NUTRITION and EXERCISE:    - BMI Body mass index is 27.29 kg/m.         I have reviewed the nursing and medical assistant documentation and have confirmed the accuracy of the information            Return to clinic 1 year       Domenic Moras, MD   OB/GYN

## 2023-06-09 ENCOUNTER — Other Ambulatory Visit: Payer: Self-pay | Admitting: Gastroenterology

## 2023-07-21 ENCOUNTER — Other Ambulatory Visit
Admission: RE | Admit: 2023-07-21 | Discharge: 2023-07-21 | Disposition: A | Discharge status: Home / Self Care | Payer: Medicaid Other | Source: Ambulatory Visit | Attending: Family Medicine | Admitting: Family Medicine

## 2023-07-21 ENCOUNTER — Other Ambulatory Visit: Payer: Self-pay | Admitting: Family Medicine

## 2023-07-21 DIAGNOSIS — E785 Hyperlipidemia, unspecified: Secondary | ICD-10-CM | POA: Insufficient documentation

## 2023-07-21 DIAGNOSIS — Z1231 Encounter for screening mammogram for malignant neoplasm of breast: Secondary | ICD-10-CM

## 2023-07-21 LAB — COMPREHENSIVE METABOLIC PANEL
ALT: 20 U/L (ref 0–35)
AST: 24 U/L (ref 0–35)
Albumin: 4.7 g/dL (ref 3.5–5.2)
Alk Phos: 97 U/L (ref 35–105)
Anion Gap: 10 (ref 7–16)
Bilirubin,Total: 0.3 mg/dL (ref 0.0–1.2)
CO2: 25 mmol/L (ref 20–28)
Calcium: 9.3 mg/dL (ref 8.6–10.2)
Chloride: 104 mmol/L (ref 96–108)
Creatinine: 0.9 mg/dL (ref 0.51–0.95)
Glucose: 99 mg/dL (ref 60–99)
Lab: 21 mg/dL — ABNORMAL HIGH (ref 6–20)
Potassium: 5 mmol/L — ABNORMAL HIGH (ref 3.3–4.6)
Sodium: 139 mmol/L (ref 133–145)
Total Protein: 7.3 g/dL (ref 6.3–7.7)
eGFR BY CREAT: 72 *

## 2023-07-21 LAB — CBC AND DIFFERENTIAL
Baso # K/uL: 0 10*3/uL (ref 0.0–0.2)
Eos # K/uL: 0 10*3/uL (ref 0.0–0.5)
Hematocrit: 42 % (ref 34–49)
Hemoglobin: 13.9 g/dL (ref 11.2–16.0)
IMM Granulocytes #: 0 10*3/uL (ref 0.0–0.0)
IMM Granulocytes: 0.5 %
Lymph # K/uL: 1.3 10*3/uL (ref 1.0–5.0)
MCV: 93 fL (ref 75–100)
Mono # K/uL: 0.4 10*3/uL (ref 0.1–1.0)
Neut # K/uL: 3.9 10*3/uL (ref 1.5–6.5)
Nucl RBC # K/uL: 0 10*3/uL (ref 0.0–0.0)
Nucl RBC %: 0 /100{WBCs} (ref 0.0–0.2)
Platelets: 234 10*3/uL (ref 150–450)
RBC: 4.6 MIL/uL (ref 4.0–5.5)
RDW: 12.7 % (ref 0.0–15.0)
Seg Neut %: 67.9 %
WBC: 5.8 10*3/uL (ref 3.5–11.0)

## 2023-07-21 LAB — LIPID PANEL
Chol/HDL Ratio: 4.1
Cholesterol: 224 mg/dL — AB
HDL: 55 mg/dL (ref 40–60)
LDL Calculated: 136 mg/dL — AB
Non HDL Cholesterol: 169 mg/dL
Triglycerides: 164 mg/dL — AB

## 2023-08-14 ENCOUNTER — Ambulatory Visit: Payer: Medicaid Other | Admitting: Obstetrics and Gynecology

## 2023-08-19 ENCOUNTER — Ambulatory Visit: Payer: Medicaid Other | Admitting: Obstetrics and Gynecology

## 2023-08-19 ENCOUNTER — Encounter: Payer: Self-pay | Admitting: Obstetrics and Gynecology

## 2023-08-28 ENCOUNTER — Other Ambulatory Visit: Payer: Self-pay | Admitting: Family Medicine

## 2023-08-28 DIAGNOSIS — Z1231 Encounter for screening mammogram for malignant neoplasm of breast: Secondary | ICD-10-CM

## 2023-09-03 ENCOUNTER — Ambulatory Visit
Admission: RE | Admit: 2023-09-03 | Discharge: 2023-09-03 | Disposition: A | Discharge status: Home / Self Care | Payer: Medicaid Other | Source: Ambulatory Visit | Attending: Family Medicine | Admitting: Family Medicine

## 2023-09-03 DIAGNOSIS — Z1231 Encounter for screening mammogram for malignant neoplasm of breast: Secondary | ICD-10-CM | POA: Insufficient documentation

## 2023-09-09 ENCOUNTER — Other Ambulatory Visit: Payer: Self-pay

## 2023-09-24 ENCOUNTER — Ambulatory Visit: Payer: Medicaid Other | Admitting: Obstetrics and Gynecology

## 2023-10-15 ENCOUNTER — Other Ambulatory Visit: Payer: Self-pay

## 2023-11-12 ENCOUNTER — Ambulatory Visit: Payer: Medicaid Other | Admitting: Obstetrics and Gynecology

## 2023-12-22 ENCOUNTER — Other Ambulatory Visit: Payer: Self-pay

## 2023-12-23 ENCOUNTER — Ambulatory Visit: Payer: Medicaid Other | Admitting: Obstetrics and Gynecology

## 2023-12-23 ENCOUNTER — Encounter: Payer: Self-pay | Admitting: Obstetrics and Gynecology

## 2023-12-23 VITALS — BP 106/72 | Ht 64.0 in | Wt 159.0 lb

## 2023-12-23 DIAGNOSIS — Z01419 Encounter for gynecological examination (general) (routine) without abnormal findings: Secondary | ICD-10-CM

## 2023-12-23 NOTE — Progress Notes (Signed)
 [facility]  Annual Gynecologic Exam    CC: Annual GYN appointment/     HPI: Michele Chandler is a 63 y.o. [id number] female who presents to clinic today for annual exam. Doing well. No gyn concerns. No vb or pelvic pain hx of pmb in 2022. 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       PAST MEDICAL HISTORY  Past Medical History:   Diagnosis Date    Bladder infection     Blood transfusion affecting pregnancy 1982    GERD (gastroesophageal reflux disease)     High cholesterol     Kidney infection     Migraines     Motion sickness         PAST SURGICAL HISTORY  Past Surgical History:   Procedure Laterality Date    abdominalplasty  2007    and rectus repair    CESAREAN SECTION, CLASSIC      X3    CHOLECYSTECTOMY  1998    KNEE ARTHROSCOPY - LIGAMENT RECONSTRUCTION  1978    right    MENISCECTOMY  2001    right    PR HYSTEROSCOPY BX ENDOMETRIUM&/POLYPC W/WO D&C N/A 02/01/2021    Procedure: HYSTEROSCOPY, WITH TISSUE REMOVAL, USING HYSTEROSCOPIC ROTATING CUTTER BLADE, DILATION AND CURETTAGE WITH AVETA;  Surgeon: Domenic Moras, MD;  Location: [facility];  Service: OBGYN    tubal ligation          FAMILY HISTORY  Family History   Problem Relation Age of Onset    Alzheimer's disease Mother     Cerebral aneurysm Father     Diabetes Sister     High Blood Pressure Brother     Breast cancer Neg Hx     Colon cancer Neg Hx     Cervical cancer Neg Hx     Ovarian cancer Neg Hx     Uterine cancer Neg Hx         MEDICATIONS  Prior to Admission medications    Medication Sig Start Date End Date Taking? Authorizing Provider   Multiple Vitamin (MULTIVITAMIN ADULT PO) MVI   Yes [provider]   calcium citrate-vitamin D (CITRACAL+D) 315-250 mg-unit per tablet Take 2 tablets by mouth 2 times daily.   Yes [provider]   zinc sulfate (ZINCATE) 220 (50 Zn) MG capsule Take 1 capsule (220 mg total) by mouth daily.   Yes [provider]   Aspirin-Acetaminophen-Caffeine (EXCEDRIN MIGRAINE PO) Take by mouth as needed     [provider]        ALLERGIES  Allergies[1]     OBSTETRIC HISTORY  OB History   Gravida Para Term Preterm AB Living   6 4 1 3 2 4    SAB IAB Ectopic Multiple Live Births   2       4      # Outcome Date GA Lbr Len/2nd Weight Sex Type Anes PTL Lv   6 Term 1998 [redacted]w[redacted]d   M CS-LTranv      5 Preterm 1995 [redacted]w[redacted]d    CS-LTranv      4 Preterm 1982 [redacted]w[redacted]d    CS-LTranv         Complications: Placenta Previa   3 Preterm 1982 [redacted]w[redacted]d    CS-LTranv         Complications: Placenta Previa   2 SAB            1 SAB  GYNECOLOGIC HISTORY  LMP: No LMP recorded. Patient is postmenopausal.     SOCIAL HISTORY  Social History[2]        REVIEW OF SYSTEMS    Negative except for noted in HPI       PHYSICAL EXAM  Vitals:    12/23/23 0917   BP: 106/72   BP Location: Left arm   Patient Position: Sitting   Cuff Size: adult   Weight: 72.1 kg (159 lb)   Height: 1.626 m (5\' 4" )       General - alert, well appearing, and in no distress  HEENT - Normocepahlic, atraumatic.   Resp - clear to auscultation bilaterally and Normal respiratory effort.  CV - Normal rate, regular rhythm  Breast - Symmetric, normal contour, skin normal, no palpable masses or nodules, nipples everted without discharge, no palpable axillary lymphadenopathy.   Abdomen - Soft, nondistended, nontender   Pelvic - Normal-appearing external genitalia, vagina, cervix flush with vagina. Uterus with normal contour, no adnexal tenderness or fullness appreciated.  Physiologic vaginal discharge.  No CMT.  Extremities - No edema noted   Female chaperone present for entire pelvic exam.     ASSESSMENT AND PLAN  63 y.o. [id number] female presents for annual GYN exam.        GYN SCREENING:    - last UXL:[id number]/UVO 2022     PREVENTATIVE HEALTH:    - mammo: 2024   - colonoscopy: 2022                NUTRITION and EXERCISE:    - BMI Body mass index is 27.29 kg/m.         I have reviewed the nursing and medical assistant documentation and have confirmed the accuracy of the information             Return to clinic 1 year       Domenic Moras, MD   OB/GYN            [1]   Allergies  Allergen Reactions    Morphine Nausea And Vomiting    Influenza Vaccines Other (See Comments)     NEEDS PRESERVATIVE FREE   [2]   Social History  Socioeconomic History    Marital status: Single   Tobacco Use    Smoking status: Never    Smokeless tobacco: Never   Substance and Sexual Activity    Alcohol use: Yes     Comment: rare    Drug use: Never    Sexual activity: Yes     Partners: Male     Birth control/protection: Post-menopausal

## 2023-12-23 NOTE — Progress Notes (Signed)
 Pt arrived alone for her annual exam. No gyn concerns today.

## 2024-01-09 ENCOUNTER — Emergency Department

## 2024-01-09 ENCOUNTER — Observation Stay
Admission: EM | Admit: 2024-01-09 | Discharge: 2024-01-10 | Disposition: A | Discharge status: Home / Self Care | Source: Ambulatory Visit | Attending: Internal Medicine | Admitting: Internal Medicine

## 2024-01-09 ENCOUNTER — Other Ambulatory Visit: Payer: Self-pay

## 2024-01-09 DIAGNOSIS — R9431 Abnormal electrocardiogram [ECG] [EKG]: Secondary | ICD-10-CM

## 2024-01-09 DIAGNOSIS — R202 Paresthesia of skin: Secondary | ICD-10-CM

## 2024-01-09 DIAGNOSIS — Z733 Stress, not elsewhere classified: Secondary | ICD-10-CM | POA: Insufficient documentation

## 2024-01-09 DIAGNOSIS — R200 Anesthesia of skin: Principal | ICD-10-CM | POA: Insufficient documentation

## 2024-01-09 DIAGNOSIS — Z789 Other specified health status: Secondary | ICD-10-CM

## 2024-01-09 DIAGNOSIS — G459 Transient cerebral ischemic attack, unspecified: Secondary | ICD-10-CM | POA: Diagnosis not present

## 2024-01-09 DIAGNOSIS — I10 Essential (primary) hypertension: Principal | ICD-10-CM | POA: Insufficient documentation

## 2024-01-09 DIAGNOSIS — Z79899 Other long term (current) drug therapy: Secondary | ICD-10-CM | POA: Insufficient documentation

## 2024-01-09 DIAGNOSIS — E785 Hyperlipidemia, unspecified: Secondary | ICD-10-CM | POA: Insufficient documentation

## 2024-01-09 LAB — CBC AND DIFFERENTIAL
Baso # K/uL: 0 10*3/uL (ref 0.0–0.2)
Eos # K/uL: 0 10*3/uL (ref 0.0–0.5)
Hematocrit: 41 % (ref 34–49)
Hemoglobin: 13.5 g/dL (ref 11.2–16.0)
IMM Granulocytes #: 0 10*3/uL (ref 0.0–0.0)
IMM Granulocytes: 0.5 %
Lymph # K/uL: 1.3 10*3/uL (ref 1.0–5.0)
MCV: 91 fL (ref 75–100)
Mono # K/uL: 0.4 10*3/uL (ref 0.1–1.0)
Neut # K/uL: 3.7 10*3/uL (ref 1.5–6.5)
Nucl RBC # K/uL: 0 10*3/uL (ref 0.0–0.0)
Nucl RBC %: 0 /100{WBCs} (ref 0.0–0.2)
Platelets: 237 10*3/uL (ref 150–450)
RBC: 4.5 MIL/uL (ref 4.0–5.5)
RDW: 13 % (ref 0.0–15.0)
Seg Neut %: 67.5 %
WBC: 5.5 10*3/uL (ref 3.5–11.0)

## 2024-01-09 LAB — LIPID PANEL
Chol/HDL Ratio: 4.4
Cholesterol: 252 mg/dL — AB
HDL: 57 mg/dL (ref 40–60)
LDL Calculated: 167 mg/dL — AB
Non HDL Cholesterol: 195 mg/dL
Triglycerides: 155 mg/dL — AB

## 2024-01-09 LAB — HOLD SST

## 2024-01-09 LAB — COMPREHENSIVE METABOLIC PANEL
ALT: 25 U/L (ref 0–35)
AST: 23 U/L (ref 0–35)
Albumin: 4.4 g/dL (ref 3.5–5.2)
Alk Phos: 93 U/L (ref 35–105)
Anion Gap: 9 (ref 7–16)
Bilirubin,Total: 0.5 mg/dL (ref 0.0–1.2)
CO2: 25 mmol/L (ref 20–28)
Calcium: 9.2 mg/dL (ref 8.6–10.2)
Chloride: 100 mmol/L (ref 96–108)
Creatinine: 0.79 mg/dL (ref 0.51–0.95)
Glucose: 95 mg/dL (ref 60–99)
Lab: 15 mg/dL (ref 6–20)
Potassium: 4.5 mmol/L (ref 3.3–4.6)
Sodium: 134 mmol/L (ref 133–145)
Total Protein: 6.9 g/dL (ref 6.3–7.7)
eGFR BY CREAT: 84 *

## 2024-01-09 LAB — MAGNESIUM: Magnesium: 2.3 mg/dL (ref 1.6–2.5)

## 2024-01-09 LAB — T4, FREE: Free T4: 1.3 ng/dL (ref 0.9–1.7)

## 2024-01-09 LAB — PROTIME-INR
INR: 1 (ref 0.9–1.1)
Protime: 11.3 s (ref 10.0–12.9)

## 2024-01-09 LAB — TSH: TSH: 2.61 u[IU]/mL (ref 0.27–4.20)

## 2024-01-09 LAB — APTT: aPTT: 33.7 s (ref 25.8–37.9)

## 2024-01-09 LAB — POCT GLUCOSE: Glucose POCT: 101 mg/dL — ABNORMAL HIGH (ref 60–99)

## 2024-01-09 MED ORDER — LABETALOL HCL 100 MG PO TABS *I*
100.0000 mg | ORAL_TABLET | Freq: Four times a day (QID) | ORAL | Status: DC | PRN
Start: 2024-01-09 — End: 2024-01-10

## 2024-01-09 MED ORDER — ENOXAPARIN SODIUM 40 MG/0.4ML IJ SOSY *I*
40.0000 mg | PREFILLED_SYRINGE | Freq: Every day | INTRAMUSCULAR | Status: DC
Start: 2024-01-09 — End: 2024-03-09
  Administered 2024-01-09: 40 mg via SUBCUTANEOUS
  Filled 2024-01-09: qty 0.4

## 2024-01-09 MED ORDER — OXYCODONE HCL 5 MG PO TABS *I*
5.0000 mg | ORAL_TABLET | Freq: Four times a day (QID) | ORAL | Status: DC | PRN
Start: 2024-01-09 — End: 2024-01-12

## 2024-01-09 MED ORDER — IOHEXOL 350 MG/ML (OMNIPAQUE) IV SOLN 500ML BOTTLE *I*
1.0000 mL | Freq: Once | INTRAVENOUS | Status: AC
Start: 2024-01-09 — End: 2024-01-09
  Administered 2024-01-09: 70 mL via INTRAVENOUS

## 2024-01-09 MED ORDER — ATORVASTATIN CALCIUM 20 MG PO TABS *I*
20.0000 mg | ORAL_TABLET | Freq: Once | ORAL | Status: AC
Start: 2024-01-09 — End: 2024-01-09
  Administered 2024-01-09: 20 mg via ORAL
  Filled 2024-01-09: qty 1

## 2024-01-09 MED ORDER — LABETALOL HCL 20 MG/4 ML IV SOLN WRAPPED *I*
10.0000 mg | Freq: Once | INTRAVENOUS | Status: AC
Start: 2024-01-09 — End: 2024-01-09
  Administered 2024-01-09: 10 mg via INTRAVENOUS
  Filled 2024-01-09: qty 4

## 2024-01-09 MED ORDER — ONDANSETRON HCL 2 MG/ML IV SOLN *I*
4.0000 mg | Freq: Four times a day (QID) | INTRAMUSCULAR | Status: DC | PRN
Start: 2024-01-09 — End: 2024-01-10

## 2024-01-09 MED ORDER — POLYETHYLENE GLYCOL 3350 PO PACK 17 GM *I*
17.0000 g | PACK | Freq: Every day | ORAL | Status: DC | PRN
Start: 2024-01-09 — End: 2024-03-09

## 2024-01-09 MED ORDER — AMLODIPINE BESYLATE 5 MG PO TABS *I*
5.0000 mg | ORAL_TABLET | Freq: Every day | ORAL | Status: DC
Start: 2024-01-09 — End: 2024-03-09
  Administered 2024-01-09 – 2024-01-10 (×2): 5 mg via ORAL
  Filled 2024-01-09 (×2): qty 1

## 2024-01-09 MED ORDER — OXYCODONE HCL 5 MG PO TABS *I*
10.0000 mg | ORAL_TABLET | Freq: Four times a day (QID) | ORAL | Status: DC | PRN
Start: 2024-01-09 — End: 2024-01-12

## 2024-01-09 MED ORDER — ATORVASTATIN CALCIUM 40 MG PO TABS *I*
40.0000 mg | ORAL_TABLET | Freq: Every evening | ORAL | Status: DC
Start: 2024-01-09 — End: 2024-03-09
  Filled 2024-01-09: qty 1

## 2024-01-09 MED ORDER — CLOPIDOGREL BISULFATE 300 MG PO TABS *I*
300.0000 mg | ORAL_TABLET | Freq: Once | ORAL | Status: AC
Start: 2024-01-09 — End: 2024-01-09
  Administered 2024-01-09: 300 mg via ORAL
  Filled 2024-01-09: qty 1

## 2024-01-09 MED ORDER — ASPIRIN 81 MG PO TBEC *I*
81.0000 mg | DELAYED_RELEASE_TABLET | Freq: Every day | ORAL | Status: DC
Start: 2024-01-10 — End: 2024-01-17
  Administered 2024-01-10: 81 mg via ORAL
  Filled 2024-01-09: qty 1

## 2024-01-09 MED ORDER — ACETAMINOPHEN 325 MG PO TABS *I*
650.0000 mg | ORAL_TABLET | Freq: Four times a day (QID) | ORAL | Status: DC | PRN
Start: 2024-01-09 — End: 2024-03-09
  Administered 2024-01-10: 650 mg via ORAL
  Filled 2024-01-09: qty 2

## 2024-01-09 MED ORDER — ACETAMINOPHEN 650 MG RE SUPP *I*
650.0000 mg | Freq: Four times a day (QID) | RECTAL | Status: DC | PRN
Start: 2024-01-09 — End: 2024-01-10

## 2024-01-09 MED ORDER — CLOPIDOGREL BISULFATE 75 MG PO TABS *I*
75.0000 mg | ORAL_TABLET | Freq: Every day | ORAL | Status: DC
Start: 2024-01-10 — End: 2024-03-10
  Administered 2024-01-10: 75 mg via ORAL
  Filled 2024-01-09: qty 1

## 2024-01-09 MED ORDER — ASPIRIN 81 MG PO CHEW *I*
162.0000 mg | CHEWABLE_TABLET | Freq: Once | ORAL | Status: AC
Start: 2024-01-09 — End: 2024-01-09
  Administered 2024-01-09: 162 mg via ORAL
  Filled 2024-01-09: qty 2

## 2024-01-09 NOTE — ED Notes (Signed)
 Report Given To  Handoff report      Descriptive Sentence / Reason for Admission     Chief Complaint/Presenting Symptoms:     Pt comes into the ED by private car for right facial numbness/tingling and right arm numbness. Pt states she has had a stressful day today. Pt states that she was crying when this happened. Dr. Benjiman Core in triage. Stroke alert called. BG 101.    Prehospital medications given: No       Admitting Diagnosis:  Numbness      Active Issues / Relevant Events     Orientation Status: Patient is AAO x 4, resides with husband and is independent in ADL's    Ambulates with: independently    Skin/wounds/specialty bed needs: none    Precautions: none    O2 needs: none    PO status/How pt takes pills: Regular diet, takes pills whole    Any meds or valuables with patients:     Psychosocial/Behavior/1:1 needs: none    Mode of Transport to IP (pt is in stretcher/wheelchair/IP bed):  stretcher      To Do List  Neuro checks      Anticipatory Guidance / Discharge Planning  Admit to home.  Discharge home anticipated

## 2024-01-09 NOTE — ED Notes (Signed)
 EKG done and given to Dr. Vara Guardian, MD.

## 2024-01-09 NOTE — Progress Notes (Signed)
 Tele-Stroke Telephone Consultation Note        Patient:  Michele Chandler  Patient's DOB:  1961/03/01  eMRN:  [id number]  Physician/APP:  [facility]:  [facility]  Location of Telemedicine Provider:  Home office    I spoke with the provider on 01/09/2024 at 3:11 PM.  He provided me the following information.     History Obtained        Gwenda Heiner is a 63 y.o. female, with known vascular risk factors of hyperlipidemia and hypertension.  The patient presented following sudden onset of right-sided face arm and leg numbness.  The patient was last known well (LKW) at 1:55 PM.  No further accompanying neurologic symptoms were reported.    The patient has no prior stroke or TIA history.  The patient currently takes no antithrombotic medications.     Exam Described        Per the provider's exam, the patient showed only possible light touch decrement across the right side.  Reportedly, NIHSS was 1.    Of note, presenting BPs 180s/100s.     Data Reviewed        I personally did review radiology studies.    Per my review, CT head showed no acute intracranial abnormality; no findings to suggest acute/subacute transcortical infarction, intracranial mass/lesion, or hemorrhage.    Per my review, CTA head and neck showed no evidence of a hemodynamic significant stenosis/occlusion of the cervical or intracranial vessels.     Impression and Recommendations  (based on the above information)        The patient's clinical presentation could be consistent with an acute, nondisabling stroke.  Alternative explanations would possibly include malignant hypertension.    IV thrombolytic decision:  01/09/2024 at 3:11 PM    I advised against IV thrombolytic therapy, given the patient's presentation is not consistent with a disabling neurologic deficit.      I advised against transfer to [facility] ED for consideration of endovascular therapy (EVT) given the patient's imaging findings showed no evidence of a large vessel occlusion.    I  recommended the following disposition - Patient to undergo additional workup in the spoke ED to determine appropriate disposition.    I provided the below recommendations:    Treat SBP >180  Consider hyperacute MRI  If positive for cerebral ischemia, would admit with further testing (echocardiogram and lab work) and initiation of a new antithrombotic  If negative, and patient's symptoms improve with blood pressure control, would favor malignant hypertension as the possible explanation for her clinical presentation, not initiating antithrombotic, and recommend PCP follow-up  Consider starting a high intensity statin (past LDL 130s)    Please contact our service through the [facility]) at [phone] upon completion of the above studies for our final impression and recommendations.    Please contact our service if the patient's neurological status changes or any new questions arise.      The provider acknowledged this impression and these recommendations.        Author: Ree Shay, MD  as of: 01/09/2024  at: 3:11 PM  Neurology Attending  [facility]

## 2024-01-09 NOTE — ED Provider Notes (Signed)
 History   No chief complaint on file.    Patient is a 63 year old female with past medical history of GERD, HLD who presents emergency department with complaints of right sided numbness and tingling.  Patient reports that she has been very stressed out at home taking care of her daughter with a disability as well as one of her other children who has been having mental health problems throughout the day today.  She reports that she has been feeling very anxious as a result of these home stressors and had been breathing heavily at home.  She states that at approximately 1355 she developed numbness and tingling along the right side of her face, right side of her tongue, right arm and right leg.  Denies speech disturbance.  Denies weakness.  Denies any change in coordination.  Still able to ambulate steadily.  She states that she has a slight headache to the left side of her head.  Denies any new visual disturbance today but states that she has had a "floater" that she sees out of her right eye only that is been persistent over the past week without any acute change and she still has this today.  Denies history of hypertension.  She has elevated blood pressure on arrival.  NIH of scale of 1 for right sided decreased sensation in comparison to left.  Took 162 mg aspirin prior to arrival.      History provided by:  Patient  Language interpreter used: No          Medical/Surgical/Family History     Past Medical History:   Diagnosis Date    Bladder infection     Blood transfusion affecting pregnancy 1982    GERD (gastroesophageal reflux disease)     High cholesterol     Kidney infection     Migraines     Motion sickness         Patient Active Problem List   Diagnosis Code    Status post hysteroscopy Z98.890            Past Surgical History:   Procedure Laterality Date    abdominalplasty  2007    and rectus repair    CESAREAN SECTION, CLASSIC      X3    CHOLECYSTECTOMY  1998    KNEE ARTHROSCOPY - LIGAMENT RECONSTRUCTION   1978    right    MENISCECTOMY  2001    right    PR HYSTEROSCOPY BX ENDOMETRIUM&/POLYPC W/WO D&C N/A 02/01/2021    Procedure: HYSTEROSCOPY, WITH TISSUE REMOVAL, USING HYSTEROSCOPIC ROTATING CUTTER BLADE, DILATION AND CURETTAGE WITH AVETA;  Surgeon: Domenic Moras, MD;  Location: [facility] MAIN OR;  Service: OBGYN    tubal ligation            Social History[1]          Review of Systems   Constitutional:  Negative for chills and fever.   HENT:  Negative for congestion and rhinorrhea.    Respiratory:  Negative for shortness of breath.    Cardiovascular:  Negative for chest pain.   Gastrointestinal:  Negative for abdominal pain, diarrhea, nausea and vomiting.   Genitourinary:  Negative for dysuria and hematuria.   Skin:  Negative for rash.   Neurological:  Positive for numbness and headaches. Negative for weakness.   Psychiatric/Behavioral:  Negative for confusion. The patient is nervous/anxious.    All other systems reviewed and are negative.      Physical Exam     Triage  Vitals     First Recorded BP: (!) 186/106, Resp: 24, Temp: 36.8 C (98.2 F), Temp src: Infrared Oxygen Therapy SpO2: 99 %, Oximetry Source: Lt Hand, O2 Device: None (Room air), Heart Rate: 90, (01/09/24 1427)  .      Physical Exam  Vitals and nursing note reviewed.   Constitutional:       General: She is not in acute distress.     Appearance: Normal appearance. She is not ill-appearing, toxic-appearing or diaphoretic.   HENT:      Head: Normocephalic and atraumatic.      Right Ear: External ear normal.      Left Ear: External ear normal.      Nose: Nose normal.      Mouth/Throat:      Mouth: Mucous membranes are moist.      Pharynx: Oropharynx is clear. No oropharyngeal exudate or posterior oropharyngeal erythema.   Eyes:      General: No visual field deficit.     Extraocular Movements: Extraocular movements intact.      Conjunctiva/sclera: Conjunctivae normal.      Pupils: Pupils are equal, round, and reactive to light.   Cardiovascular:      Rate and  Rhythm: Normal rate and regular rhythm.      Pulses: Normal pulses.      Heart sounds: Normal heart sounds. No murmur heard.     No friction rub. No gallop.   Pulmonary:      Effort: Pulmonary effort is normal. No respiratory distress.      Breath sounds: Normal breath sounds. No stridor. No wheezing, rhonchi or rales.   Abdominal:      General: Abdomen is flat. There is no distension.      Palpations: Abdomen is soft. There is no mass.      Tenderness: There is no abdominal tenderness.   Musculoskeletal:         General: No deformity. Normal range of motion.      Cervical back: Normal range of motion and neck supple.   Skin:     General: Skin is warm and dry.      Capillary Refill: Capillary refill takes less than 2 seconds.   Neurological:      General: No focal deficit present.      Mental Status: She is alert and oriented to person, place, and time. Mental status is at baseline.      GCS: GCS eye subscore is 4. GCS verbal subscore is 5. GCS motor subscore is 6.      Cranial Nerves: No dysarthria or facial asymmetry.      Sensory: Sensory deficit (decreased sensation to right face in comparison to left, as well as right arm and leg in comparison to left) present.      Motor: No weakness, tremor, atrophy, abnormal muscle tone, seizure activity or pronator drift.      Coordination: Coordination is intact. Coordination normal. Finger-Nose-Finger Test and Heel to [facility] Test normal.      Gait: Gait is intact.      Comments: NIH SS of 1 for decreased sensation along right side of body.   Psychiatric:         Mood and Affect: Mood is anxious.         Behavior: Behavior normal.      Comments: Anxious/tearful.         Medical Decision Making   Patient seen by me on:  01/09/2024    Assessment:  63 year old female presenting to emergency department with complaints of right-sided numbness beginning at 1355 today.  History of hyperlipidemia.  NIH of scale of 1, code stroke activated.    Differential diagnosis:  CVA, TIA,  hypertensive urgency, hypertensive emergency, anxiety, electrolyte imbalance    Plan:  Orders Placed This Encounter      CT head without contrast for stroke      CT angio head for stroke      CT angio neck carotid for stroke      CBC and differential      Protime-INR      APTT      TSH      T4, free      Comprehensive metabolic panel      Magnesium      Continuous telemetry, non-protocol      Vital signs      Pulse oximetry, continuous      EKG 12 lead (initial)      Insert peripheral IV      iohexol (OMNIPAQUE) injection ( multi-use bottle) 1-150 mL        EKG Interpretation:  Tracing reviewed by myself, normal sinus rhythm, no ischemic changes    Independent interpretation of imaging: CT head noncontrast imaging reviewed, no obvious acute infarct or hemorrhage.    ED Course and Disposition:  2:51 PM Paged telestroke provider.    3:10 PM Spoke Dr. Cordie Grice. Agrees that symptoms are mild and does not recommend TNK. Will treat blood pressure with 10 mg labetalol as this may also be hypertensive urgency/emergency. Obtain MRI. No additional antiplatelet agents at this time, but may start 20 mg atorvastatin given history of HLD.    Labs reviewed, no acute abnormality.    CTA head/neck imaging report reviewed, no acute abnormality noted. MRI ordered.    MRI head report reviewed: no acute abnormality noted.    6:25 PM Patient reevaluated, she still has some slight decreased sensation of right face and arm in comparison to left but improved, and sensation now equal in bilateral legs. Patient now normotensive 129/83 after labetalol 10 mg x1. Resting comfortably.    6:54 PM Spoke again with Dr. Cordie Grice of telestroke, informed of unremarkable MRI, recommends hospitalization for full stroke workup for suspected nonvisualized cerebral ischemia, DAPT x21 days.  Additional 162 mg aspirin ordered as patient took 162 mg aspirin PTA, 300 mg plavix ordered.    Spoke with Dr. Bjorn Pippin who accepts.           Vara Guardian,  MD          Critical Care    There is a high probability of imminent or life threatening deterioration due to CNS failure or compromise (Code stroke).    Acute interventions include initial hx & physical exam, eval pt's response to tx, order & perform tx & interventions, IV meds (specify below), order & review of radiology studies, re-eval of pt's condition and discussions w/other provider.    IV medications given:  Labetalol    I personally spent 30 cumulative minutes performing critical care interventions to this patient as outlined above. This excludes separately billable procedures.       Author:  Vara Guardian, MD           [1]   Social History  Tobacco Use    Smoking status: Never    Smokeless tobacco: Never   Substance Use Topics    Alcohol use: Yes     Comment: rare  Drug use: Never        Vara Guardian, MD  01/09/24 [phone]

## 2024-01-09 NOTE — H&P (Signed)
 History and Physical  Hospital Medicine    Patient Name: Michele Chandler  Date of Birth: 1961/05/09  MRN#: [id number]  Admission Date: 01/09/2024  Date of Service: 01/09/2024    Chief Complaint:       History of Present Illness:  HPI  63 year old female with multiple previous medical history and significant for migraines, GERD, hypertension, and hyperlipidemia.  Patient presented to the emergency room today because of numbness on the right side of the face and right upper extremity.  Patient said her condition started at around 1:00 this afternoon as sudden onset of numbness initially on the right side of the face slowly progressing to involve the right side of the neck, right shoulder and eventually involving the whole of the right upper extremity.  There were no other symptoms reported like dizziness, blurring of vision, double vision, slurred speech, facial symmetry, focal weakness.  She also denies any chest pain, she did have some nausea but no vomiting, no abdominal pain, and no changes to her GU/GI habits because of the above symptoms she decided to come to the emergency department.    Past Medical History:   Diagnosis Date    Bladder infection     Blood transfusion affecting pregnancy 1982    GERD (gastroesophageal reflux disease)     High cholesterol     Kidney infection     Migraines     Motion sickness      Past Surgical History:   Procedure Laterality Date    abdominalplasty  2007    and rectus repair    CESAREAN SECTION, CLASSIC      X3    CHOLECYSTECTOMY  1998    KNEE ARTHROSCOPY - LIGAMENT RECONSTRUCTION  1978    right    MENISCECTOMY  2001    right    PR HYSTEROSCOPY BX ENDOMETRIUM&/POLYPC W/WO D&C N/A 02/01/2021    Procedure: HYSTEROSCOPY, WITH TISSUE REMOVAL, USING HYSTEROSCOPIC ROTATING CUTTER BLADE, DILATION AND CURETTAGE WITH AVETA;  Surgeon: Domenic Moras, MD;  Location: [facility];  Service: OBGYN    tubal ligation       Family History   Problem Relation Age of Onset    Alzheimer's disease  Mother     Cerebral aneurysm Father     Diabetes Sister     High Blood Pressure Brother     Breast cancer Neg Hx     Colon cancer Neg Hx     Cervical cancer Neg Hx     Ovarian cancer Neg Hx     Uterine cancer Neg Hx      Social History     Socioeconomic History    Marital status: Single   Tobacco Use    Smoking status: Never    Smokeless tobacco: Never   Substance and Sexual Activity    Alcohol use: Yes     Comment: rare    Drug use: Never    Sexual activity: Yes     Partners: Male     Birth control/protection: Post-menopausal       Allergies: Allergies[1]    (Not in a hospital admission)     Current Facility-Administered Medications   Medication Dose Route Frequency    enoxaparin (LOVENOX) injection 40 mg  40 mg Subcutaneous Daily @ 2100    polyethylene glycol (GLYCOLAX,MIRALAX) powder 17 g  17 g Oral Daily PRN    acetaminophen (TYLENOL) tablet 650 mg  650 mg Oral Q6H PRN    Or    acetaminophen (TYLENOL)  suppository 650 mg  650 mg Rectal Q6H PRN    oxyCODONE (ROXICODONE) immediate release tablet 5 mg  5 mg Oral Q6H PRN    Or    oxyCODONE (ROXICODONE) immediate release tablet 10 mg  10 mg Oral Q6H PRN    atorvastatin (LIPITOR) tablet 40 mg  40 mg Oral QPM    ondansetron (ZOFRAN) injection 4 mg  4 mg Intravenous Q6H PRN    amLODIPine (NORVASC) tablet 5 mg  5 mg Oral Daily    labetalol (NORMODYNE) tablet 100 mg  100 mg Oral Q6H PRN     Current Outpatient Medications   Medication    Multiple Vitamin (MULTIVITAMIN ADULT PO)    calcium citrate-vitamin D (CITRACAL+D) 315-250 mg-unit per tablet    zinc sulfate (ZINCATE) 220 (50 Zn) MG capsule    Aspirin-Acetaminophen-Caffeine (EXCEDRIN MIGRAINE PO)       Review of Systems:   Review of Systems   Constitutional:  Negative for chills, fever and malaise/fatigue.   HENT:  Negative for sore throat.    Eyes:  Negative for pain.   Respiratory:  Negative for cough and hemoptysis.    Cardiovascular:  Negative for chest pain and palpitations.   Gastrointestinal:  Positive for nausea.  Negative for abdominal pain, constipation, diarrhea and vomiting.   Genitourinary: Negative.    Musculoskeletal: Negative.    Skin:  Negative for itching and rash.   Neurological:  Positive for tingling and sensory change. Negative for dizziness, tremors, speech change, focal weakness, seizures, loss of consciousness, weakness and headaches.         Last Nursing documented pain:        Patient Vitals for the past 24 hrs:   BP Temp Temp src Pulse Resp SpO2 Height Weight   01/09/24 1831 156/83 -- -- 73 16 100 % -- --   01/09/24 1700 129/83 -- -- 70 24 94 % -- --   01/09/24 1632 129/84 -- -- 69 19 99 % -- --   01/09/24 1600 149/86 -- -- 70 17 93 % -- --   01/09/24 1530 149/87 -- -- 70 14 100 % -- --   01/09/24 1510 (!) 182/93 -- -- 80 15 100 % -- --   01/09/24 1427 (!) 186/106 36.8 C (98.2 F) Infrared 90 24 99 % 1.626 m (5\' 4" ) 71.2 kg (157 lb)            Physical Exam  Constitutional:       General: She is not in acute distress.     Appearance: She is not ill-appearing.   HENT:      Head: Normocephalic and atraumatic.      Nose: Nose normal.      Mouth/Throat:      Mouth: Mucous membranes are moist.   Eyes:      General:         Right eye: No discharge.         Left eye: No discharge.      Extraocular Movements: Extraocular movements intact.   Cardiovascular:      Rate and Rhythm: Normal rate and regular rhythm.   Pulmonary:      Effort: Pulmonary effort is normal.      Breath sounds: No wheezing, rhonchi or rales.   Abdominal:      Palpations: Abdomen is soft.      Tenderness: There is no abdominal tenderness.   Musculoskeletal:      Cervical back: Neck supple.   Skin:  General: Skin is warm and dry.   Neurological:      Mental Status: She is alert and oriented to person, place, and time.      Sensory: Sensory deficit (Still having some numbness on the right side of the face and right upper extremity) present.      Motor: No weakness.         Lab Results: All labs in the last 72 hours:  Recent Results (from the  past 72 hours)   POCT glucose    Collection Time: 01/09/24  2:32 PM   Result Value Ref Range    Glucose POCT 101 (H) 60 - 99 mg/dL   CBC and differential    Collection Time: 01/09/24  3:19 PM   Result Value Ref Range    WBC 5.5 3.5 - 11.0 THOU/uL    RBC 4.5 4.0 - 5.5 MIL/uL    Hemoglobin 13.5 11.2 - 16.0 g/dL    Hematocrit 41 34 - 49 %    MCV 91 75 - 100 fL    RDW 13.0 0.0 - 15.0 %    Platelets 237 150 - 450 THOU/uL    Seg Neut % 67.5 %    Neut # K/uL 3.7 1.5 - 6.5 THOU/uL    Lymph # K/uL 1.3 1.0 - 5.0 THOU/uL    Mono # K/uL 0.4 0.1 - 1.0 THOU/uL    Eos # K/uL 0.0 0.0 - 0.5 THOU/uL    Baso # K/uL 0.0 0.0 - 0.2 THOU/uL    Nucl RBC % 0.0 0.0 - 0.2 /100 WBC    Nucl RBC # K/uL 0.0 0.0 - 0.0 THOU/uL    IMM Granulocytes # 0.0 0.0 - 0.0 THOU/uL    IMM Granulocytes 0.5 %   Protime-INR    Collection Time: 01/09/24  3:19 PM   Result Value Ref Range    Protime 11.3 10.0 - 12.9 sec    INR 1.0 0.9 - 1.1   APTT    Collection Time: 01/09/24  3:19 PM   Result Value Ref Range    aPTT 33.7 25.8 - 37.9 sec   TSH    Collection Time: 01/09/24  3:19 PM   Result Value Ref Range    TSH 2.61 0.27 - 4.20 uIU/mL   T4, free    Collection Time: 01/09/24  3:19 PM   Result Value Ref Range    Free T4 1.3 0.9 - 1.7 ng/dL   Comprehensive metabolic panel    Collection Time: 01/09/24  3:19 PM   Result Value Ref Range    Sodium 134 133 - 145 mmol/L    Potassium 4.5 3.3 - 4.6 mmol/L    Chloride 100 96 - 108 mmol/L    CO2 25 20 - 28 mmol/L    Anion Gap 9 7 - 16    UN 15 6 - 20 mg/dL    Creatinine 1.61 0.96 - 0.95 mg/dL    eGFR BY CREAT 84 *    Glucose 95 60 - 99 mg/dL    Calcium 9.2 8.6 - 04.5 mg/dL    Total Protein 6.9 6.3 - 7.7 g/dL    Albumin 4.4 3.5 - 5.2 g/dL    Bilirubin,Total 0.5 0.0 - 1.2 mg/dL    AST 23 0 - 35 U/L    ALT 25 0 - 35 U/L    Alk Phos 93 35 - 105 U/L   Magnesium    Collection Time: 01/09/24  3:19 PM   Result  Value Ref Range    Magnesium 2.3 1.6 - 2.5 mg/dL   Hold SST    Collection Time: 01/09/24  3:19 PM   Result Value Ref Range     Hold SST HOLD TUBE    Lipid Panel (Reflex to Direct  LDL if Triglycerides more than 400)    Collection Time: 01/09/24  3:19 PM   Result Value Ref Range    Cholesterol 252 (!) mg/dL    Triglycerides 161 (!) mg/dL    HDL 57 40 - 60 mg/dL    LDL Calculated 096 (!) mg/dL    Non HDL Cholesterol 195 mg/dL    Chol/HDL Ratio 4.4        Radiology Impressions (last 3 days):  MR head without contrast    Result Date: 01/09/2024  No acute intracranial abnormality. Mild chronic microangiopathy. END OF IMPRESSION       [facility] submits this DICOM format image data and final report to the [facility], an independent secure electronic health information exchange, on a reciprocally searchable basis (with patient authorization) for a minimum of 12 months after exam date.    CT angio head for stroke    Result Date: 01/09/2024  Patent anterior and posterior circulations of the brain, without large vessel occlusion or aneurysm. Patent carotid and vertebral arteries of the neck, without significant stenosis or dissection. END OF IMPRESSION       [facility] submits this DICOM format image data and final report to the [facility], an independent secure electronic health information exchange, on a reciprocally searchable basis (with patient authorization) for a minimum of 12 months after exam date.    CT angio neck carotid for stroke    Result Date: 01/09/2024  Patent anterior and posterior circulations of the brain, without large vessel occlusion or aneurysm. Patent carotid and vertebral arteries of the neck, without significant stenosis or dissection. END OF IMPRESSION       [facility] submits this DICOM format image data and final report to the [facility], an independent secure electronic health information exchange, on a reciprocally searchable basis (with patient authorization) for a minimum of 12 months after exam date.    CT head without contrast for stroke    Result Date: 01/09/2024  No acute intracranial abnormality. White  matter changes suggestive of minimal chronic microangiopathy. END OF IMPRESSION I have personally reviewed the images and the Resident's/Fellow's interpretation and agree with or edited the findings.       [facility] submits this DICOM format image data and final report to the [facility], an independent secure electronic health information exchange, on a reciprocally searchable basis (with patient authorization) for a minimum of 12 months after exam date.     Currently Active/Followed Hospital Problems:  Active Hospital Problems    Diagnosis     *!*TIA (transient ischemic attack)     Numbness     Uncontrolled hypertension        Assessment/Plan:     63 year old female with multiple previous medical history and significant for migraines, GERD, hypertension, and hyperlipidemia.  Patient presented to the emergency room today because of numbness on the right side of the face and right upper extremity.  Initial workup done at the ED showed a CBC with a WBC count of 5.5, hemoglobin of 13.5, hematocrit of 41, and a platelet count of 2 37K.  Her bleeding parameters are normal.  Comprehensive metabolic panel is generally unremarkable, magnesium is normal as well At  2.3 mg per DL.   Her EKG shows sinus rhythm.   CT of the head shows no acute intracranial abnormality, CT angio of the head and neck shows patent anterior and posterior circulations of the brain without large vessel occlusion or aneurysm, there is also a patent carotid and vertebral arteries of the neck without significant stenosis or dissection.  MRI of the brain shows no acute intracranial abnormality and there is mild chronic microangiopathy.  Patient's blood pressure on presentation was around 186 mmHg systolic and he will she was given a dose of labetalol.  Stroke neurology was consulted and notes in chart.  Hospitalist service was then called for this admission.    Plan of care or management includes:  Numbness right side of the face and right upper  extremity  Possible TIA  -Admit  -Neurochecks  -Telemetry  -Dual antiplatelet therapy  -Blood pressure control  -2D echo  -Statins  -Physical therapy and Occupational Therapy eval and treatment  -Repeat labs in a.m.    Elevated blood pressure  -Labetalol as needed  -Norvasc p.o.    Hyperlipidemia  -Statin as above      Medication Reconciliation: done  Diet:low fat  IVF: NA  DVT prophylaxis: Lovenox  Code Status/GOC:Full  Family update/concerns: NA   EDD: 3.29.2025             Signed:  Nani Skillern, MD  01/09/2024  7:20 PM       Author: Nani Skillern, MD  Note created: 01/09/2024  at: 7:20 PM          Dictation software used to create this note, small grammatical errors may be present.         [1]   Allergies  Allergen Reactions    Morphine Nausea And Vomiting    Influenza Vaccines Other (See Comments)     NEEDS PRESERVATIVE FREE

## 2024-01-09 NOTE — ED Triage Notes (Signed)
 Pt comes into the ED by private car for right facial numbness/tingling and right arm numbness. Pt states she has had a stressful day today. Pt states that she was crying when this happened. Dr. Benjiman Core in triage. Stroke alert called. BG 101.    Prehospital medications given: No     Vivien Rota, RN

## 2024-01-10 DIAGNOSIS — F410 Panic disorder [episodic paroxysmal anxiety] without agoraphobia: Secondary | ICD-10-CM

## 2024-01-10 DIAGNOSIS — R200 Anesthesia of skin: Secondary | ICD-10-CM

## 2024-01-10 LAB — EKG 12-LEAD
P: 62 deg
PR: 158 ms
QRS: 81 deg
QRSD: 85 ms
QT: 384 ms
QTc: 438 ms
Rate: 78 {beats}/min
T: 106 deg

## 2024-01-10 LAB — CBC AND DIFFERENTIAL
Baso # K/uL: 0 10*3/uL (ref 0.0–0.2)
Eos # K/uL: 0.1 10*3/uL (ref 0.0–0.5)
Hematocrit: 39 % (ref 34–49)
Hemoglobin: 12.7 g/dL (ref 11.2–16.0)
IMM Granulocytes #: 0 10*3/uL (ref 0.0–0.0)
IMM Granulocytes: 0.6 %
Lymph # K/uL: 1.6 10*3/uL (ref 1.0–5.0)
MCV: 91 fL (ref 75–100)
Mono # K/uL: 0.5 10*3/uL (ref 0.1–1.0)
Neut # K/uL: 2.6 10*3/uL (ref 1.5–6.5)
Nucl RBC # K/uL: 0 10*3/uL (ref 0.0–0.0)
Nucl RBC %: 0 /100{WBCs} (ref 0.0–0.2)
Platelets: 218 10*3/uL (ref 150–450)
RBC: 4.3 MIL/uL (ref 4.0–5.5)
RDW: 13.1 % (ref 0.0–15.0)
Seg Neut %: 53.8 %
WBC: 4.8 10*3/uL (ref 3.5–11.0)

## 2024-01-10 LAB — LIPID PANEL
Chol/HDL Ratio: 4.3
Cholesterol: 221 mg/dL — AB
HDL: 51 mg/dL (ref 40–60)
LDL Calculated: 151 mg/dL — AB
Non HDL Cholesterol: 170 mg/dL
Triglycerides: 109 mg/dL

## 2024-01-10 LAB — BASIC METABOLIC PANEL
Anion Gap: 8 (ref 7–16)
CO2: 24 mmol/L (ref 20–28)
Calcium: 8.9 mg/dL (ref 8.6–10.2)
Chloride: 109 mmol/L — ABNORMAL HIGH (ref 96–108)
Creatinine: 0.85 mg/dL (ref 0.51–0.95)
Glucose: 92 mg/dL (ref 60–99)
Lab: 14 mg/dL (ref 6–20)
Potassium: 4.2 mmol/L (ref 3.3–4.6)
Sodium: 141 mmol/L (ref 133–145)
eGFR BY CREAT: 77 *

## 2024-01-10 LAB — HEMOGLOBIN A1C: Hemoglobin A1C: 5 %

## 2024-01-10 LAB — MAGNESIUM: Magnesium: 2.3 mg/dL (ref 1.6–2.5)

## 2024-01-10 LAB — PHOSPHORUS: Phosphorus: 3.8 mg/dL (ref 2.7–4.5)

## 2024-01-10 LAB — TSH: TSH: 2.99 u[IU]/mL (ref 0.27–4.20)

## 2024-01-10 MED ORDER — AMLODIPINE BESYLATE 5 MG PO TABS *I*
5.0000 mg | ORAL_TABLET | Freq: Every day | ORAL | 0 refills | Status: AC
Start: 2024-01-11 — End: 2024-01-21

## 2024-01-10 MED ORDER — ATORVASTATIN CALCIUM 40 MG PO TABS *I*
40.0000 mg | ORAL_TABLET | Freq: Every evening | ORAL | 0 refills | Status: AC
Start: 2024-01-10 — End: 2024-01-20

## 2024-01-10 NOTE — Progress Notes (Signed)
 Physical Therapy Evaluation    Patient Class: Observation (billed as outpatient)    Discharge recommendation:  Anticipate return to prior living arrangement, No further PT needed after discharge  Equipment recommendations upon discharge: None  Mobility recommendations for nursing: independent in room  PT needs to be seen again prior to discharge: No    Patient Name: Michele Chandler  Date of Birth: 06/22/1961  MRN#: [id number]  Admission Date: 01/09/2024    History of present illness:   Natalya Domzalski is a 63 y.o. female who presented to the emergency room because of numbness on the right side of the face and right upper extremity.   Prior physical therapy history for above complaint within the last year: no    Active problem:  Principal Problem:    Uncontrolled hypertension  Active Problems:    Numbness          Comorbidities affecting treatment/recovery for this admission (*see bolded below):   Past Medical History:   Diagnosis Date    Bladder infection     Blood transfusion affecting pregnancy 1982    GERD (gastroesophageal reflux disease)     High cholesterol     Kidney infection     Migraines     Motion sickness      Past Surgical History:   Procedure Laterality Date    abdominalplasty  2007    and rectus repair    CESAREAN SECTION, CLASSIC      X3    CHOLECYSTECTOMY  1998    KNEE ARTHROSCOPY - LIGAMENT RECONSTRUCTION  1978    right    MENISCECTOMY  2001    right    PR HYSTEROSCOPY BX ENDOMETRIUM&/POLYPC W/WO D&C N/A 02/01/2021    Procedure: HYSTEROSCOPY, WITH TISSUE REMOVAL, USING HYSTEROSCOPIC ROTATING CUTTER BLADE, DILATION AND CURETTAGE WITH AVETA;  Surgeon: Domenic Moras, MD;  Location: [facility] MAIN OR;  Service: OBGYN    tubal ligation         Medical Course:  EKG 12 lead (initial)  Result Date: 01/10/2024  Sinus rhythm Borderline right axis deviation Low voltage, precordial leads    MR head without contrast  Result Date: 01/09/2024  No acute intracranial abnormality. Mild chronic microangiopathy. END OF IMPRESSION        [facility] submits this DICOM format image data and final report to the [facility], an independent secure electronic health information exchange, on a reciprocally searchable basis (with patient authorization) for a minimum of 12 months after exam date.    CT angio head for stroke  Result Date: 01/09/2024  Patent anterior and posterior circulations of the brain, without large vessel occlusion or aneurysm. Patent carotid and vertebral arteries of the neck, without significant stenosis or dissection. END OF IMPRESSION       [facility] submits this DICOM format image data and final report to the [facility], an independent secure electronic health information exchange, on a reciprocally searchable basis (with patient authorization) for a minimum of 12 months after exam date.    CT angio neck carotid for stroke  Result Date: 01/09/2024  Patent anterior and posterior circulations of the brain, without large vessel occlusion or aneurysm. Patent carotid and vertebral arteries of the neck, without significant stenosis or dissection. END OF IMPRESSION       [facility] submits this DICOM format image data and final report to the [facility], an independent secure electronic health information exchange, on a reciprocally searchable basis (with patient authorization) for a minimum of 12  months after exam date.    CT head without contrast for stroke  Result Date: 01/09/2024  No acute intracranial abnormality. White matter changes suggestive of minimal chronic microangiopathy. END OF IMPRESSION I have personally reviewed the images and the Resident's/Fellow's interpretation and agree with or edited the findings.       [facility] submits this DICOM format image data and final report to the [facility], an independent secure electronic health information exchange, on a reciprocally searchable basis (with patient authorization) for a minimum of 12 months after exam date.    CBC:  Recent Labs   Lab 01/10/24  0638  01/09/24  1519   WBC 4.8 5.5   RBC 4.3 4.5   Hemoglobin 12.7 13.5   Hematocrit 39 41   MCV 91 91   Platelets 218 237     No components found with this basename: "DRW"    BNP:  Recent Labs   Lab 01/10/24  0638 01/09/24  1519   Sodium 141 134   Potassium 4.2 4.5   Chloride 109* 100   CO2 24 25   Anion Gap 8 9   UN 14 15   Creatinine 0.85 0.79   Glucose 92 95   Calcium 8.9 9.2   Total Protein  --  6.9   Albumin  --  4.4   Bilirubin,Total  --  0.5   AST  --  23   ALT  --  25     INR:       Lab results: 01/09/24  1519   Protime 11.3   INR 1.0     ABG:No results for input(s): "APH", "APCO2", "APO2", "AHCO3", "ABE", "AOSAT" in the last 72 hours.    SUBJECTIVE  "I feel better"    OBJECTIVE/SOCIAL HISTORY    Height: 162.6 cm (5\' 4" )  Weight: 71.2 kg (157 lb)     PT Adult Assessment - 01/10/24 0835          Prior Living     Prior Living Situation Reported by patient;Obtained via chart     Lives With Family   partner and children, is primary caregiver of dtr with Down Syndome    Type of Home Two Story Home     # Steps to Enter Home 1     # Rails to [employer] Home 2     # Of Steps In Home 14     # Rails in Home 2     Location of Bedrooms 2nd floor     Location of Bathrooms 2nd floor     Bathroom Accessibility Accessible     Current Home Equipment None     Additional Comments Pt lives with her family in a 2 story home, she is a retired Engineer, civil (consulting) and now is the primary caregiver of her dtr with Down Syndrome. Pt is active and independent with all mobility, ADLs, IADLs at baseline        Prior Function Level    Prior Function Level Reported by patient     Transfers Independent     Transfer Devices None     Walking Independent     Walking assistive devices used None     Stair negotiation Independent        PT Tracking    PT TRACKING PT Discontinue     Type of Session evaluation     SW Request? No        Treatment Day    Treatment Day (HH /  URR) 1     Additional Comments of 1        Precautions/Observations    Precautions used Yes     LDA  Observation None     Was patient wearing a mask? No     PPE worn by writer [facility]     Fall Precautions General falls precautions   per nursing pt has been  independent in the room    Other BP sitting at rest 128/100, HR 79. Following mobility assessment, pt reporting slight increased SOB (reports having the flu about a month ago), SpO2 98%, HR 79, BP 142/86. Reports having a "floater" out of the R eye persistent over the last week or so        Current Pain Assessment    Pain Assessment / Reassessment Assessment     Pain Scale 0-10 (Numeric Scale for Pain Intensity)      0-10 Scale 0     Pain Comments denies        Vision     Current Vision Adequate for PT session     Additional Comments see above, denies blurred vision        Cognition    Cognition Tested     Arousal/Alertness Appropriate responses to stimuli     Orientation Alert and oriented x4     Ability to Follow Instructions Follows all commands and directions without difficulty        UE Assessment    Assessment Focus Range of Motion;Strength        LUE (degrees)    Overall ROM AROM WFL        RUE (degrees)    Overall ROM AROM WFL        LUE Strength    Shoulder Flexion 5/5     Elbow Flexion 5/5     LUE Grip Strength strong and equal bilaterally        RUE Strength    Shoulder Flexion 5/5     Elbow Flexion 5/5     RUE Grip Strength strong and equal bilaterally        LE Assessment    Assessment Focus Range of Motion;Strength        LLE (degrees)    Overall ROM AROM WFL        RLE (degrees)    Overall ROM AROM WFL        LLE Strength    Hip Flexion 5/5     Knee Extension 5/5     Ankle Dorsiflexion 5/5        RLE Strength    Hip Flexion 5/5     Knee Extension 5/5     Ankle Dorsiflexion 5/5        Sensation    Sensation Deficit noted     Additional Comments denies numbness/tingling in BLEs, pt does endorse slight residual R sided tingling however reports being much improved from yesterday        Coordination    Coordination --   adequate for PT evaluation        Bed Mobility    Bed mobility Tested     Supine to Sit Independent     Sit to Supine Independent     Additional comments pt is independent in mobilizing from supine<>sitting EOB with no safety concerns        Transfers    Transfers Tested     Sit to Stand Independent     Stand to sit Independent  Transfer Assistive Device none     Additional comments pt is independent in functional transfers, transitions from sitting<>standing without UE support for push off, approximates to surfaces appropriately, denies dizziness upon standing        Mobility    Mobility Tested     Gait Pattern Within functional limits     Ambulation Assist Independent     Ambulation Distance (Feet) 200'     Ambulation Assistive Device None     Stairs Assistance Independent     Stair Management Technique No rail;Alternating pattern;Forwards     Number of Stairs 4     Additional comments pt ambulates with a steady gait, no instances of LOB or instability demonstrated, no device required. pt denies dizziness/lightheadedness with ambulation. able to negotiate 4 stairs with no rail independently with no instances of LOB or safety concerns        Family/Caregiver Training`    Patient/Family/Caregiver training Yes     Patient training Role of physical therapy in hospital and plan for evaluation and follow up;Discharge planning;PT plan of care after evaluation        Balance    Balance Tested     Sitting - Static Independent;Unsupported     Sitting - Dynamic Independent;Unsupported     Standing - Static Independent;Unsupported     Standing - Dynamic Independent;Unsupported        Functional Outcome Measures    Functional Outcome Measures Yes        PT AM-PAC Mobility    Turning over in bed? None: Modified Independence/independent     Moving from lying on back to sitting on the side of the bed? None: Modified Independence/independent     Moving to and from a bed to a chair? None: Modified Independence/independent     Sitting down on and standing  up from a chair with arms? None: Modified Independence/independent     Need to walk in hospital room? None: Modified Independence/independent     Climbing 3 - 5 steps with a railing? None: Modified Independence/independent     Total Raw Score 24     Standardized Score - Calculated 61.14     % Functional Impairment - Calculated 0%        Assessment    Brief Assessment Patient at baseline function;Skilled acute PT is not indicated;Patient demonstrates adequate mobility skills to return home        Plan/Recommendation    PT Treatment Interventions No further PT interventions     PT Frequency One time visit     PT Mobility Recommendations independent in room     PT Discharge Recommendations Anticipate return to prior living arrangement;No further PT needed after discharge     PT Discharge Equipment Recommended None     PT Assessment/Recommendations Reviewed With: Advanced Practice Provider;Nursing;Patient     Next PT Visit none further, evaluation only     PT Additional Recommendations no PT barriers to return home     PT needs to see patient prior to DC  No        Time Calculation    PT Timed Codes 0     PT Untimed Codes 19     PT Unbilled Time 5   pt requesting to finish breakfast upon arrival prior to working with Clinical research associate    PT Total Treatment 19                   PT Timed Codes breakdown: total time (minutes)  Therapeutic Activity    0   Gait Training    0   Therapeutic Exercise    0   Neuromuscular Re-education    0   Self-Care/ Home Management    0   Wheelchair Mobility    0   Manual Therapy    0   Canalith Repositioning   0           ASSESSMENT:  Patient is currently at her functional baseline.  Therefore no skilled PT services are indicated while in this setting.     Personal factors affecting treatment/recovery:  none identified    Patient/ Evaluation complexity:    Low level as indicated by above stability of condition, personal factors, environmental factors and comorbidities in addition to their impairments  found on physical exam.    PT Prognosis:N/A- as indicated by patient presenting at baseline function and/or with no anticipated functional progression in this setting with skilled intervention.    Patient education understanding: good      PLAN    PT frequency:  Discontinue inpatient PT        Thank you for the referral.    Guinevere Scarlet, PT, DPT

## 2024-01-10 NOTE — Progress Notes (Signed)
 Pharmacist Prior-to-Admission Medication History Review     I reviewed the medication history that was performed by a medication history technician and the list below reflects the best possible prior to admission medication list.     Sources of information:  Patient;Outside Meds Reconciliation (DrFirst)       Comments: This medication reconciliation was completed by pharmacy after the physician completed his/her portion of the admission med reconciliation.     Recommendation:  Please re-review the home med list and make any appropriate medication changes to the inpatient medication orders         Medications Prior to Admission   Medication Sig    Aspirin-Acetaminophen-Caffeine (EXCEDRIN MIGRAINE PO) Take by mouth as needed    calcium citrate-vitamin D (CITRACAL+D) 315-250 mg-unit per tablet Take 2 tablets by mouth 2 times daily.    Multiple Vitamin (MULTIVITAMIN ADULT PO) Take 1 tablet by mouth daily.    zinc sulfate (ZINCATE) 220 (50 Zn) MG capsule Take 1 capsule (220 mg total) by mouth daily.        Geoffry Paradise, PharmD

## 2024-01-10 NOTE — Discharge Instr - Meds (Signed)
 Please start amlodipine 5 mg daily and atorvastatin 40 mg daily, discuss both of these medications with your PCP at your hospital follow up

## 2024-01-10 NOTE — Discharge Instructions (Addendum)
 Follow a healthy diet  Activity as tolerated   Call tomorrow for a follow up appointment with your PCP   Talk with him about hydroxizine for panic

## 2024-01-10 NOTE — Progress Notes (Signed)
 01/10/24 1300   UM Patient Class Review   Patient Class Review Observation     Patient Class effective 01/09/24

## 2024-01-10 NOTE — Discharge Summary (Addendum)
 Name: Michele Chandler MRN: [id number] DOB: Dec 02, 1960     Admit Date: 01/09/2024   Date of Discharge: 01/10/2024     Patient was accepted for discharge to   Home or Self Care [1]       Discharge Attending Physician: Lourdes Sledge, DO      Hospitalization Summary       Medicine Discharge Note    Date: 01/10/2024   Name: Michele Chandler  MRN: [id number]   DOB: 1960-11-15   Admit Date: 01/09/2024   Discharge Date: 01/10/2024    Admitting Provider: Roselind Messier, MD   Primary Care Physician: Earnest Conroy, MD   Discharge Provider: Lourdes Sledge, DO     Reason for Admission:    No chief complaint on file.       Admitting Diagnosis:      Uncontrolled hypertension  Active Hospital Problems    Diagnosis     Principal Problem: Uncontrolled hypertension     Numbness       Resolved Hospital Problems    Diagnosis    TIA (transient ischemic attack)          HOSPITAL COURSE:       SUMMARY  Michele Chandler is a 63 y.o. female with migraines, GERD, hypertension - not currently treated, and hyperlipidemia - not currently treated.  Patient presented to the emergency room because of numbness on the right side of the face and right upper extremity.      She admits to stressors and thinks she had a panic attack.     CT of the head shows no acute intracranial abnormality, CT angio of the head and neck shows patent anterior and posterior circulations of the brain without large vessel occlusion or aneurysm, there is also a patent carotid and vertebral arteries of the neck without significant stenosis or dissection.    MRI of the brain shows no acute intracranial abnormality and there is mild chronic microangiopathy.      Patient's blood pressure on presentation was around 186 mmHg systolic and he will she was given a dose of labetalol.  Stroke neurology was consulted and suspects blood pressure related and does not recommend antiplatelet therapy at this time     -no need for echo  -no need for clopidogrel  -risk factor modification  with blood pressure control and statin +/- Aspirin 81 mg daily      Possible panic attack  - may benefit from hydroxizine prn    Elevated blood pressure  -Norvasc p.o.     Hyperlipidemia  -Statin started          HOSPITAL PROBLEMS    Active Hospital Problems    Diagnosis    Uncontrolled hypertension    Numbness      Resolved Hospital Problems    Diagnosis    TIA (transient ischemic attack)        Labs while hospitalized:  BMP/Electrolytes CBC   Recent Labs     01/10/24  0638 01/09/24  1519   Sodium 141 134   Potassium 4.2 4.5   Chloride 109* 100   CO2 24 25   UN 14 15   Creatinine 0.85 0.79   Glucose 92 95   Calcium 8.9 9.2   Magnesium 2.3 2.3   Albumin  --  4.4   Phosphorus 3.8  --      Estimated Creatinine Clearance: 65.56 mL/min (based on SCr of 0.85 mg/dL).   Recent Labs     01/10/24  0981 01/09/24  1519   WBC 4.8 5.5   Hemoglobin 12.7 13.5   Hematocrit 39 41   Platelets 218 237   MCV 91 91   RDW 13.1 13.0   Seg Neut % 53.8 67.5        Liver Function Cardiac Studies   Recent Labs     01/09/24  1519   AST 23   ALT 25   Bilirubin,Total 0.5     No components found with this basename: "ALKPHOS"     No results for input(s): "TROP", "TROP0", "TROP1", "TROP1D", "TROP3", "TROP3D", "TROPR", "TRPCT" in the last 8760 hours.  Marland Kitchenlast  No results for input(s): "PROBNP" in the last 72 hours.       Coagulation Microbiology       Lab results: 01/09/24  1519   Protime 11.3   INR 1.0           Lab results: 01/09/24  1519   aPTT 33.7                     Glucose       Lab results: 01/10/24  1914 01/09/24  1519 01/09/24  1432 07/21/23  1125   Glucose 92 95  --  99   Glucose POCT  --   --  101*  --        No results found for: "HA1C"                                           Imaging/Diagnostics         EKG 12 lead (initial)  Result Date: 01/10/2024  Sinus rhythm Borderline right axis deviation Low voltage, precordial leads    MR head without contrast  Result Date: 01/09/2024  No acute intracranial abnormality. Mild chronic  microangiopathy. END OF IMPRESSION       [facility] Imaging submits this DICOM format image data and final report to the [facility], an independent secure electronic health information exchange, on a reciprocally searchable basis (with patient authorization) for a minimum of 12 months after exam date.    CT angio head for stroke  Result Date: 01/09/2024  Patent anterior and posterior circulations of the brain, without large vessel occlusion or aneurysm. Patent carotid and vertebral arteries of the neck, without significant stenosis or dissection. END OF IMPRESSION       [facility] Imaging submits this DICOM format image data and final report to the [facility], an independent secure electronic health information exchange, on a reciprocally searchable basis (with patient authorization) for a minimum of 12 months after exam date.    CT angio neck carotid for stroke  Result Date: 01/09/2024  Patent anterior and posterior circulations of the brain, without large vessel occlusion or aneurysm. Patent carotid and vertebral arteries of the neck, without significant stenosis or dissection. END OF IMPRESSION       UR Imaging submits this DICOM format image data and final report to the [facility], an independent secure electronic health information exchange, on a reciprocally searchable basis (with patient authorization) for a minimum of 12 months after exam date.    CT head without contrast for stroke  Result Date: 01/09/2024  No acute intracranial abnormality. White matter changes suggestive of minimal chronic microangiopathy. END OF IMPRESSION I have personally reviewed the images and the Resident's/Fellow's interpretation and agree with or edited  the findings.       UR Imaging submits this DICOM format image data and final report to the [facility], an independent secure electronic health information exchange, on a reciprocally searchable basis (with patient authorization) for a minimum of 12 months after exam date.        POST-DISCHARGE FOLLOWUP ITEMS:      PLEASE FOLLOWUP on these PENDING LABS:  Pending Labs Upon Discharge       Order Current Status    Hemoglobin A1c In process          Laboratory Tests Pending Results       Order Current Status    Hemoglobin A1c In process                CODE STATUS AT DISCHARGE:   Full Code    DISCHARGE DISPOSITION: home    PROCEDURES:    As noted above        DISCHARGE MEDICATIONS:      Medication List        START taking these medications      amLODIPine 5 mg tablet  Commonly known as: NORVASC  Take 1 tablet (5 mg total) by mouth daily for 10 days.  Start taking on: January 11, 2024     atorvastatin 40 mg tablet  Commonly known as: LIPITOR  Take 1 tablet (40 mg total) by mouth every evening for 10 days.            CONTINUE taking these medications      calcium citrate-vitamin D 315-250 mg-unit per tablet  Commonly known as: CITRACAL+D     MULTIVITAMIN ADULT PO     zinc sulfate 220 (50 Zn) MG capsule  Commonly known as: ZINCATE            STOP taking these medications      aspirin-acetaminophen-caffeine          Medication Instructions:    Please start amlodipine 5 mg daily and atorvastatin 40 mg daily, discuss both of these medications with your PCP at your hospital follow up             Where to Get Your Medications        These medications were sent to [id number] - [place], [place] - [address] AT [facility] [address]  [address], [place] [address]      Phone: [phone]   amLODIPine 5 mg tablet  atorvastatin 40 mg tablet           DISCHARGE ENCOUNTER     SUBJECTIVE:       Feels she has a lot of stress at home at the moment.  She had previously been on HCTZ but not for the past several years      DISCHARGE PHYSICAL EXAM:  BP 112/68 (BP Location: Right arm)   Pulse 76   Temp 36 C (96.8 F) (Infrared)   Resp 16   Ht 1.626 m (5\' 4" )   Wt 71.2 kg (157 lb)   SpO2 97%   BMI 26.95 kg/m   GENERAL & CONSTITUTIONAL:  well-developed,  well-nourished, and in no distress   HEENMT:  anicteric, EOMI, MMM   CARDIOVASCULARr: S1, S2 regular, No murmurs   PULMONARY & CHEST: Effort normal and No wheezes, no crackles   ABDOMEN: Soft. Bowel sounds are normal, no distension, no tenderness   MUSCULOSKELETAL:  no edema   SKIN: Skin is warm and  dry   NEUROLOGIC & PSYCHIATRIC: Alert & Oriented to name, place.  Mood pleasant. No facial asymmetry     DISCHARGE CONDITION:   Stable  DISCHARGE INSTRUCTIONS:  Follow a healthy diet  Activity as tolerated   Call tomorrow for a follow up appointment with your PCP   Talk with him about hydroxizine for panic  Start amlodipine 5 mg daily and atorvastatin 40 mg daily, discuss both of these medications with your PCP at your hospital follow up  I spent a total of 45 minutes in the discharge care and arrangements of this patient today.     Alan Mulder, D.O.  01/10/2024, 10:45 AM

## 2024-01-11 ENCOUNTER — Ambulatory Visit

## 2024-05-25 ENCOUNTER — Other Ambulatory Visit: Payer: Self-pay | Admitting: Gastroenterology

## 2024-08-01 ENCOUNTER — Other Ambulatory Visit
Admission: RE | Admit: 2024-08-01 | Discharge: 2024-08-01 | Disposition: A | Discharge status: Home / Self Care | Source: Ambulatory Visit | Attending: Family Medicine | Admitting: Family Medicine

## 2024-08-01 DIAGNOSIS — R030 Elevated blood-pressure reading, without diagnosis of hypertension: Secondary | ICD-10-CM | POA: Insufficient documentation

## 2024-08-01 DIAGNOSIS — E785 Hyperlipidemia, unspecified: Secondary | ICD-10-CM | POA: Insufficient documentation

## 2024-08-01 LAB — COMPREHENSIVE METABOLIC PANEL
ALT: 19 U/L (ref 0–35)
AST: 25 U/L (ref 0–35)
Albumin: 4.7 g/dL (ref 3.5–5.2)
Alk Phos: 108 U/L — ABNORMAL HIGH (ref 35–105)
Anion Gap: 9 (ref 7–16)
Bilirubin,Total: 0.4 mg/dL (ref 0.0–1.2)
CO2: 26 mmol/L (ref 20–28)
Calcium: 9.8 mg/dL (ref 8.6–10.2)
Chloride: 104 mmol/L (ref 96–108)
Creatinine: 0.9 mg/dL (ref 0.51–0.95)
Glucose: 94 mg/dL (ref 60–99)
Lab: 19 mg/dL (ref 6–20)
Potassium: 4.9 mmol/L — ABNORMAL HIGH (ref 3.3–4.6)
Sodium: 139 mmol/L (ref 133–145)
Total Protein: 7.4 g/dL (ref 6.3–7.7)
eGFR BY CREAT: 71

## 2024-08-01 LAB — CBC AND DIFFERENTIAL
Baso # K/uL: 0 THOU/uL (ref 0.0–0.2)
Eos # K/uL: 0.1 THOU/uL (ref 0.0–0.5)
Hematocrit: 45 % (ref 34–49)
Hemoglobin: 14.5 g/dL (ref 11.2–16.0)
IMM Granulocytes #: 0 THOU/uL (ref 0–0)
IMM Granulocytes: 0.4 %
Lymph # K/uL: 1.5 THOU/uL (ref 1.0–5.0)
MCV: 92 fL (ref 75–100)
Mono # K/uL: 0.4 THOU/uL (ref 0.1–1.0)
Neut # K/uL: 3.6 THOU/uL (ref 1.5–6.5)
Nucl RBC # K/uL: 0 THOU/uL
Nucl RBC %: 0 /100{WBCs} (ref 0.0–0.2)
Platelets: 258 THOU/uL (ref 150–450)
RBC: 5 MIL/uL (ref 4.0–5.5)
RDW: 12.7 % (ref 0.0–15.0)
Seg Neut %: 63.9 %
WBC: 5.6 THOU/uL (ref 3.5–11.0)

## 2024-08-01 LAB — LIPID PANEL
Chol/HDL Ratio: 4.2
Cholesterol: 220 mg/dL — AB
HDL: 52 mg/dL (ref 40–60)
LDL Calculated: 132 mg/dL — AB
Non HDL Cholesterol: 168 mg/dL
Triglycerides: 205 mg/dL — AB

## 2024-08-23 ENCOUNTER — Other Ambulatory Visit
Admission: RE | Admit: 2024-08-23 | Discharge: 2024-08-23 | Disposition: A | Discharge status: Home / Self Care | Payer: Self-pay | Source: Ambulatory Visit

## 2024-08-23 DIAGNOSIS — C44319 Basal cell carcinoma of skin of other parts of face: Secondary | ICD-10-CM

## 2024-08-26 LAB — SURGICAL PATHOLOGY

## 2024-09-09 ENCOUNTER — Other Ambulatory Visit: Payer: Self-pay | Admitting: Family Medicine

## 2024-09-09 DIAGNOSIS — Z1231 Encounter for screening mammogram for malignant neoplasm of breast: Secondary | ICD-10-CM

## 2024-09-14 ENCOUNTER — Ambulatory Visit

## 2024-09-20 ENCOUNTER — Other Ambulatory Visit: Payer: Self-pay | Admitting: Family Medicine

## 2024-09-20 DIAGNOSIS — Z1231 Encounter for screening mammogram for malignant neoplasm of breast: Secondary | ICD-10-CM

## 2024-09-21 ENCOUNTER — Ambulatory Visit

## 2024-12-28 ENCOUNTER — Ambulatory Visit: Admitting: Obstetrics and Gynecology
